# Patient Record
Sex: Female | Born: 1945
Health system: Southern US, Community
[De-identification: ages and names within clinical notes are randomized; demographics above are authoritative.]

## PROBLEM LIST (undated history)

## (undated) DIAGNOSIS — M1990 Unspecified osteoarthritis, unspecified site: Secondary | ICD-10-CM

## (undated) DIAGNOSIS — R011 Cardiac murmur, unspecified: Secondary | ICD-10-CM

## (undated) DIAGNOSIS — I10 Essential (primary) hypertension: Secondary | ICD-10-CM

## (undated) DIAGNOSIS — M810 Age-related osteoporosis without current pathological fracture: Secondary | ICD-10-CM

## (undated) DIAGNOSIS — G4730 Sleep apnea, unspecified: Secondary | ICD-10-CM

## (undated) DIAGNOSIS — I350 Nonrheumatic aortic (valve) stenosis: Secondary | ICD-10-CM

## (undated) DIAGNOSIS — E039 Hypothyroidism, unspecified: Secondary | ICD-10-CM

## (undated) DIAGNOSIS — J189 Pneumonia, unspecified organism: Secondary | ICD-10-CM

## (undated) HISTORY — PX: TUBAL LIGATION: SHX77

## (undated) HISTORY — PX: BREAST BIOPSY: SHX20

## (undated) HISTORY — DX: Sleep apnea, unspecified: G47.30

## (undated) HISTORY — PX: PARTIAL HYSTERECTOMY: SHX80

## (undated) HISTORY — PX: COLONOSCOPY: SHX174

## (undated) HISTORY — DX: Nonrheumatic aortic (valve) stenosis: I35.0

## (undated) HISTORY — DX: Age-related osteoporosis without current pathological fracture: M81.0

---

## 1979-03-20 HISTORY — PX: PARTIAL HYSTERECTOMY: SHX80

## 1999-06-02 ENCOUNTER — Other Ambulatory Visit: Admission: RE | Admit: 1999-06-02 | Discharge: 1999-06-02 | Payer: Self-pay | Admitting: Family Medicine

## 2002-05-23 ENCOUNTER — Other Ambulatory Visit: Admission: RE | Admit: 2002-05-23 | Discharge: 2002-05-23 | Payer: Self-pay | Admitting: Family Medicine

## 2012-10-09 ENCOUNTER — Encounter: Payer: Self-pay | Admitting: Cardiovascular Disease

## 2012-10-09 ENCOUNTER — Ambulatory Visit (INDEPENDENT_AMBULATORY_CARE_PROVIDER_SITE_OTHER): Payer: Medicare Other | Admitting: Cardiovascular Disease

## 2012-10-09 VITALS — BP 130/80 | HR 66 | Ht 64.0 in | Wt 180.0 lb

## 2012-10-09 DIAGNOSIS — I10 Essential (primary) hypertension: Secondary | ICD-10-CM

## 2012-10-09 DIAGNOSIS — R931 Abnormal findings on diagnostic imaging of heart and coronary circulation: Secondary | ICD-10-CM

## 2012-10-09 DIAGNOSIS — R9389 Abnormal findings on diagnostic imaging of other specified body structures: Secondary | ICD-10-CM

## 2012-10-09 DIAGNOSIS — R011 Cardiac murmur, unspecified: Secondary | ICD-10-CM

## 2012-10-09 NOTE — Progress Notes (Signed)
67 yo referred by Dr Claud Kelp for murmur.  Reviewed echo from [facility] with mild to moderate AR and AV sclerosis.  Patient has fatigue but no other symptoms.  Denies chest pain dyspnea palpitations or syncope.  BP normally runs ok at home with some white coat elevation. No history of fevers, RHD, or prolonged febrile illness.  Compliant with meds. ON lotrel for BP.  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  All other systems reviewed and negative   General: Affect appropriate Healthy:  appears stated age HEENT: normal Neck supple with no adenopathy JVP normal no bruits no thyromegaly Lungs clear with no wheezing and good diaphragmatic motion Heart:  S1/S2 1/6 AS/AR  murmur,rub, gallop or click PMI normal Abdomen: benighn, BS positve, no tenderness, no AAA no bruit.  No HSM or HJR Distal pulses intact with no bruits No edema Neuro non-focal Skin warm and dry No muscular weakness  Medications Current Outpatient Prescriptions  Medication Sig Dispense Refill  . amLODipine-benazepril (LOTREL) 5-20 MG per capsule Take 1 capsule by mouth daily.      [name] aspirin EC 81 MG tablet Take 81 mg by mouth daily.      . fish oil-omega-3 fatty acids 1000 MG capsule Take 2 g by mouth daily.      . Flaxseed, Linseed, (FLAX SEEDS PO) Take by mouth.      . levothyroxine (SYNTHROID, LEVOTHROID) 125 MCG tablet Take 125 mcg by mouth daily.      . pravastatin (PRAVACHOL) 20 MG tablet Take 20 mg by mouth daily.       No current facility-administered medications for this visit.    Allergies Review of patient's allergies indicates not on file.  Family History: No family history on file.  Social History: History   Social History  . Marital Status: Unknown    Spouse  Name: N/A    Number of Children: N/A  . Years of Education: N/A   Occupational History  . Not on file.   Social History Main Topics  . Smoking status: Not on file  . Smokeless tobacco: Not on file  . Alcohol Use: Not on file  . Drug Use: Not on file  . Sexually Active: Not on file   Other Topics Concern  . Not on file   Social History Narrative  . No narrative on file    Electrocardiogram:  NSR rate 66 normal ECG  Assessment and Plan

## 2012-10-09 NOTE — Assessment & Plan Note (Signed)
Benign sounding murmur.  Mld to moderate AR should not cause symptoms Normal LV size and function Continue ACE/ARB F/U echo in a year

## 2012-10-09 NOTE — Patient Instructions (Signed)
Your physician wants you to follow-up in: YEAR WITH DR Haywood Filler will receive a reminder letter in the mail two months in advance. If you don't receive a letter, please call our office to schedule the follow-up appointment. Your physician recommends that you continue on your current medications as directed. Please refer to the Current Medication list given to you today.  Your physician has requested that you have an echocardiogram. Echocardiography is a painless test that uses sound waves to create images of your heart. It provides your doctor with information about the size and shape of your heart and how well your heart's chambers and valves are working. This procedure takes approximately one hour. There are no restrictions for this procedure.  IN A YEAR SEE DR Eden Emms SAME DAY

## 2012-10-09 NOTE — Assessment & Plan Note (Signed)
Well controlled.  Continue current medications and low sodium Dash type diet.    

## 2012-10-18 ENCOUNTER — Encounter: Payer: Self-pay | Admitting: Cardiovascular Disease

## 2013-10-09 ENCOUNTER — Other Ambulatory Visit (HOSPITAL_COMMUNITY): Payer: Self-pay | Admitting: Radiology

## 2013-10-09 ENCOUNTER — Ambulatory Visit (HOSPITAL_COMMUNITY): Payer: Medicare Other | Attending: Cardiology | Admitting: Radiology

## 2013-10-09 ENCOUNTER — Encounter: Payer: Self-pay | Admitting: Cardiovascular Disease

## 2013-10-09 ENCOUNTER — Ambulatory Visit (INDEPENDENT_AMBULATORY_CARE_PROVIDER_SITE_OTHER): Payer: Medicare Other | Admitting: Cardiovascular Disease

## 2013-10-09 VITALS — BP 150/85 | HR 61 | Ht 64.0 in | Wt 185.0 lb

## 2013-10-09 DIAGNOSIS — R011 Cardiac murmur, unspecified: Secondary | ICD-10-CM

## 2013-10-09 DIAGNOSIS — I10 Essential (primary) hypertension: Secondary | ICD-10-CM

## 2013-10-09 NOTE — Assessment & Plan Note (Signed)
Benign no need for SBE or yearly echo F/U with me in a year or if new symptoms arise

## 2013-10-09 NOTE — Progress Notes (Signed)
Echocardiogram Performed. 

## 2013-10-09 NOTE — Progress Notes (Signed)
Patient ID: Holly Patrick, female   DOB: 05/15/1946, 68 y.o.   MRN: [id number] 68 yo referred by Dr Claud KelpHemrick for murmur. Reviewed echo from [facility] with mild to moderate AR and AV sclerosis. Patient has fatigue but no other symptoms. Denies chest pain dyspnea palpitations or syncope. BP normally runs ok at home with some white coat elevation. No history of fevers, RHD, or prolonged febrile illness. Compliant with meds. ON lotrel for BP. 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 There was mild focal basal hypertrophy of the septum. Systolic function was normal. The estimated ejection fraction was in the range of 55% to 60%. Utley motion was normal; there were no regional Kaplan motion abnormalities. Doppler parameters are consistent with abnormal left ventricular relaxation (grade 1 diastolic dysfunction). Doppler parameters are consistent with high ventricular filling pressure. - Aortic valve: Mild regurgitation. - Mitral valve: Calcified annulus. Mild regurgitation. - Left atrium: The atrium was mildly dilated.   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  All other systems reviewed and negative  General: Affect appropriate Healthy:  appears stated age HEENT: normal Neck supple with no adenopathy JVP normal no bruits no thyromegaly Lungs clear with no wheezing and good diaphragmatic motion Heart:  S1/S2 1/6 AR  murmur, no rub, gallop or click PMI normal Abdomen: benighn, BS positve, no tenderness, no AAA no bruit.  No HSM or HJR Distal pulses intact with no bruits No edema Neuro non-focal Skin warm  and dry No muscular weakness   Current Outpatient Prescriptions  Medication Sig Dispense Refill  . alendronate (FOSAMAX) 70 MG tablet Take 70 mg by mouth once a week. Take with a full glass of water on an empty stomach.      [name]. amLODipine-benazepril (LOTREL) 5-20 MG per capsule Take 1 capsule by mouth daily.      [name]. aspirin EC 81 MG tablet Take 81 mg by mouth daily.      . fish oil-omega-3 fatty acids 1000 MG capsule Take 2 g by mouth daily.      . Flaxseed, Linseed, (FLAX SEEDS PO) Take by mouth.      . levothyroxine (SYNTHROID, LEVOTHROID) 125 MCG tablet Take 125 mcg by mouth daily.      . pravastatin (PRAVACHOL) 20 MG tablet Take 20 mg by mouth daily.       No current facility-administered medications for this visit.    Allergies  Review of patient's allergies indicates no known allergies.  Electrocardiogram:  SR rate 61 ICRBBB   Assessment and Plan

## 2013-10-09 NOTE — Patient Instructions (Signed)
Your physician wants you to follow-up in:  12 months.  You will receive a reminder letter in the mail two months in advance. If you don't receive a letter, please call our office to schedule the follow-up appointment.   

## 2013-10-09 NOTE — Assessment & Plan Note (Signed)
White coat component Runs ok at home continue current meds

## 2014-10-09 ENCOUNTER — Ambulatory Visit: Payer: Self-pay | Admitting: Cardiovascular Disease

## 2014-12-02 NOTE — Progress Notes (Signed)
Patient ID: Holly FlashBarbara K Egerton, female   DOB: 09/13/1945, 69 y.o.   MRN: [id number] 69 y.o.  referred by Dr Claud KelpHemrick for murmur. Reviewed echo from [facility] with mild to moderate AR and AV sclerosis. Patient has fatigue but no other symptoms. Denies chest pain dyspnea palpitations or syncope. BP normally runs ok at home with some white coat elevation. No history of fevers, RHD, or prolonged febrile illness. Compliant with meds. ON lotrel for BP. 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 There was mild focal basal hypertrophy of the septum. Systolic function was normal. The estimated ejection fraction was in the range of 55% to 60%. Prettyman motion was normal; there were no regional [place]ord motion abnormalities. Doppler parameters are consistent with abnormal left ventricular relaxation (grade 1 diastolic dysfunction). Doppler parameters are consistent with high ventricular filling pressure. - Aortic valve: Mild regurgitation. - Mitral valve: Calcified annulus. Mild regurgitation. - Left atrium: The atrium was mildly dilated.   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  All other systems reviewed and negative  General: Affect appropriate Healthy:  appears stated age HEENT: normal Neck supple with no adenopathy JVP normal no bruits no thyromegaly Lungs clear with no wheezing and good diaphragmatic motion Heart:  S1/S2 1/6 AR  murmur, no rub, gallop or click PMI normal Abdomen: benighn, BS positve, no tenderness, no AAA no bruit.  No HSM or  HJR Distal pulses intact with no bruits No edema Neuro non-focal Skin warm and dry No muscular weakness   Current Outpatient Prescriptions  Medication Sig Dispense Refill  . amLODipine-benazepril (LOTREL) 5-20 MG per capsule Take 1 capsule by mouth daily.    [name]. aspirin EC 81 MG tablet Take 81 mg by mouth daily.    . fish oil-omega-3 fatty acids 1000 MG capsule Take 2 g by mouth daily.    . Flaxseed, Linseed, (FLAX SEEDS PO) Take by mouth.    . levothyroxine (SYNTHROID, LEVOTHROID) 125 MCG tablet Take 125 mcg by mouth daily.    . pravastatin (PRAVACHOL) 20 MG tablet Take 20 mg by mouth daily.     No current facility-administered medications for this visit.    Allergies  Review of patient's allergies indicates no known allergies.  Electrocardiogram:   10/09/13  SR rate 61 ICRBBB   12/04/14  SR rate 62 normal   Assessment and Plan AR:  Mild soft murmur no change active with no dyspnea clinical f/u in a year  HTN:  Well controlled.  Continue current medications and low sodium Dash type diet.   Thyroid:  On replacement TSH with primary in [place] ChoL  On statin Cholesterol is at goal.  Continue current dose of statin and diet Rx.  No myalgias or side effects.  F/U  LFT's in 6 months. No results found for: [facility]  [facility]  Sullivan Blasing

## 2014-12-04 ENCOUNTER — Encounter: Payer: Self-pay | Admitting: Cardiovascular Disease

## 2014-12-04 ENCOUNTER — Ambulatory Visit (INDEPENDENT_AMBULATORY_CARE_PROVIDER_SITE_OTHER): Payer: Medicare Other | Admitting: Cardiovascular Disease

## 2014-12-04 VITALS — BP 122/80 | HR 62 | Ht 64.0 in | Wt 176.1 lb

## 2014-12-04 DIAGNOSIS — I351 Nonrheumatic aortic (valve) insufficiency: Secondary | ICD-10-CM | POA: Diagnosis not present

## 2014-12-04 NOTE — Patient Instructions (Signed)
Medication Instructions:  NO CHANGES  Labwork: NONE  Testing/Procedures: NONE  Follow-Up: Your physician wants you to follow-up in: YEAR WITH  DR NISHAN You will receive a reminder letter in the mail two months in advance. If you don't receive a letter, please call our office to schedule the follow-up appointment.   Any Other Special Instructions Will Be Listed Below (If Applicable).   

## 2015-09-16 DIAGNOSIS — E039 Hypothyroidism, unspecified: Secondary | ICD-10-CM | POA: Diagnosis not present

## 2015-09-16 DIAGNOSIS — E7800 Pure hypercholesterolemia, unspecified: Secondary | ICD-10-CM | POA: Diagnosis not present

## 2015-09-16 DIAGNOSIS — Z79899 Other long term (current) drug therapy: Secondary | ICD-10-CM | POA: Diagnosis not present

## 2015-09-19 DIAGNOSIS — E7800 Pure hypercholesterolemia, unspecified: Secondary | ICD-10-CM | POA: Diagnosis not present

## 2015-09-19 DIAGNOSIS — E669 Obesity, unspecified: Secondary | ICD-10-CM | POA: Diagnosis not present

## 2015-09-19 DIAGNOSIS — E039 Hypothyroidism, unspecified: Secondary | ICD-10-CM | POA: Diagnosis not present

## 2015-09-19 DIAGNOSIS — Z6832 Body mass index (BMI) 32.0-32.9, adult: Secondary | ICD-10-CM | POA: Diagnosis not present

## 2015-09-19 DIAGNOSIS — I10 Essential (primary) hypertension: Secondary | ICD-10-CM | POA: Diagnosis not present

## 2015-10-28 DIAGNOSIS — Z6830 Body mass index (BMI) 30.0-30.9, adult: Secondary | ICD-10-CM | POA: Diagnosis not present

## 2015-10-28 DIAGNOSIS — R1011 Right upper quadrant pain: Secondary | ICD-10-CM | POA: Diagnosis not present

## 2015-10-30 DIAGNOSIS — R1011 Right upper quadrant pain: Secondary | ICD-10-CM | POA: Diagnosis not present

## 2015-11-26 DIAGNOSIS — E2839 Other primary ovarian failure: Secondary | ICD-10-CM | POA: Diagnosis not present

## 2015-11-26 DIAGNOSIS — M810 Age-related osteoporosis without current pathological fracture: Secondary | ICD-10-CM | POA: Diagnosis not present

## 2015-11-27 DIAGNOSIS — Z1231 Encounter for screening mammogram for malignant neoplasm of breast: Secondary | ICD-10-CM | POA: Diagnosis not present

## 2015-12-01 NOTE — Progress Notes (Signed)
Patient ID: Holly FlashBarbara K Patrick, female   DOB: 10/27/1945, 70 y.o.   MRN: [id number]   70 y.o.  referred by Dr Claud KelpHemrick for murmur. Reviewed echo from [facility] with mild to moderate AR and AV sclerosis. Patient has fatigue but no other symptoms. Denies chest pain dyspnea palpitations or syncope. BP normally runs ok at home with some white coat elevation. No history of fevers, RHD, or prolonged febrile illness. Compliant with meds. ON lotrel for BP. 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 There was mild focal basal hypertrophy of the septum. Systolic function was normal. The estimated ejection fraction was in the range of 55% to 60%. [place]ott motion was normal; there were no regional Quinton motion abnormalities. Doppler parameters are consistent with abnormal left ventricular relaxation (grade 1 diastolic dysfunction). Doppler parameters are consistent with high ventricular filling pressure. - Aortic valve: Mild regurgitation. - Mitral valve: Calcified annulus. Mild regurgitation. - Left atrium: The atrium was mildly dilated.   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  All other systems reviewed and negative  General: Affect appropriate Healthy:  appears stated age HEENT: normal Neck supple with no adenopathy JVP normal no bruits no thyromegaly Lungs clear with no wheezing and good diaphragmatic motion Heart:  S1/S2 1/6 AR  murmur, no rub, gallop  or click PMI normal Abdomen: benighn, BS positve, no tenderness, no AAA no bruit.  No HSM or HJR Distal pulses intact with no bruits No edema Neuro non-focal Skin warm and dry No muscular weakness   Current Outpatient Prescriptions  Medication Sig Dispense Refill  . amLODipine-benazepril (LOTREL) 5-20 MG per capsule Take 1 capsule by mouth daily.    [name]. aspirin EC 81 MG tablet Take 81 mg by mouth daily.    . fish oil-omega-3 fatty acids 1000 MG capsule Take 2 g by mouth daily.    . Flaxseed, Linseed, (FLAX SEEDS PO) Take by mouth.    . levothyroxine (SYNTHROID, LEVOTHROID) 125 MCG tablet Take 125 mcg by mouth daily.    . pravastatin (PRAVACHOL) 20 MG tablet Take 20 mg by mouth daily.     No current facility-administered medications for this visit.    Allergies  Review of patient's allergies indicates no known allergies.  Electrocardiogram:   10/09/13  SR rate 61 ICRBBB   12/04/14  SR rate 62 normal  12/08/15  NSR rate 60 normal   Assessment and Plan AR:  Mild soft murmur no change active with no dyspnea clinical f/u in a year  HTN:  Well controlled.  Continue current medications and low sodium Dash type diet.   Thyroid:  On replacement TSH with primary in [place] dose recently decreased  ChoL  On statin Cholesterol is at goal.  Continue current dose of statin and diet Rx.  No myalgias or side effects.  F/U  LFT's in 6 months. [facility]: [facility]  [facility]  [name]

## 2015-12-08 ENCOUNTER — Encounter: Payer: Self-pay | Admitting: Cardiovascular Disease

## 2015-12-08 ENCOUNTER — Ambulatory Visit (INDEPENDENT_AMBULATORY_CARE_PROVIDER_SITE_OTHER): Payer: Medicare Other | Admitting: Cardiovascular Disease

## 2015-12-08 VITALS — BP 150/70 | HR 62 | Ht 62.5 in | Wt 168.4 lb

## 2015-12-08 DIAGNOSIS — I351 Nonrheumatic aortic (valve) insufficiency: Secondary | ICD-10-CM

## 2015-12-08 NOTE — Patient Instructions (Signed)

## 2016-03-19 DIAGNOSIS — Z79899 Other long term (current) drug therapy: Secondary | ICD-10-CM | POA: Diagnosis not present

## 2016-03-19 DIAGNOSIS — E7800 Pure hypercholesterolemia, unspecified: Secondary | ICD-10-CM | POA: Diagnosis not present

## 2016-03-19 DIAGNOSIS — E039 Hypothyroidism, unspecified: Secondary | ICD-10-CM | POA: Diagnosis not present

## 2016-03-24 DIAGNOSIS — Z6830 Body mass index (BMI) 30.0-30.9, adult: Secondary | ICD-10-CM | POA: Diagnosis not present

## 2016-03-24 DIAGNOSIS — I10 Essential (primary) hypertension: Secondary | ICD-10-CM | POA: Diagnosis not present

## 2016-03-24 DIAGNOSIS — Z9181 History of falling: Secondary | ICD-10-CM | POA: Diagnosis not present

## 2016-03-24 DIAGNOSIS — E039 Hypothyroidism, unspecified: Secondary | ICD-10-CM | POA: Diagnosis not present

## 2016-03-24 DIAGNOSIS — Z1389 Encounter for screening for other disorder: Secondary | ICD-10-CM | POA: Diagnosis not present

## 2016-03-24 DIAGNOSIS — E7800 Pure hypercholesterolemia, unspecified: Secondary | ICD-10-CM | POA: Diagnosis not present

## 2016-05-13 DIAGNOSIS — H2513 Age-related nuclear cataract, bilateral: Secondary | ICD-10-CM | POA: Diagnosis not present

## 2016-09-21 DIAGNOSIS — I10 Essential (primary) hypertension: Secondary | ICD-10-CM | POA: Diagnosis not present

## 2016-09-21 DIAGNOSIS — E039 Hypothyroidism, unspecified: Secondary | ICD-10-CM | POA: Diagnosis not present

## 2016-09-21 DIAGNOSIS — E7800 Pure hypercholesterolemia, unspecified: Secondary | ICD-10-CM | POA: Diagnosis not present

## 2016-09-27 DIAGNOSIS — I10 Essential (primary) hypertension: Secondary | ICD-10-CM | POA: Diagnosis not present

## 2016-09-27 DIAGNOSIS — E039 Hypothyroidism, unspecified: Secondary | ICD-10-CM | POA: Diagnosis not present

## 2016-09-27 DIAGNOSIS — M810 Age-related osteoporosis without current pathological fracture: Secondary | ICD-10-CM | POA: Diagnosis not present

## 2016-09-27 DIAGNOSIS — E7800 Pure hypercholesterolemia, unspecified: Secondary | ICD-10-CM | POA: Diagnosis not present

## 2016-10-29 ENCOUNTER — Encounter: Payer: Self-pay | Admitting: Cardiovascular Disease

## 2016-11-16 NOTE — Progress Notes (Signed)
Patient ID: MARKESHIA GIEBEL, female   DOB: 1946/05/21, 71 y.o.   MRN: [id number]   71 y.o.  referred by Dr [facility] for murmur. Reviewed echo from [facility] with mild to moderate AR and AV sclerosis. Patient has fatigue but no other symptoms. Denies chest pain dyspnea palpitations or syncope. BP normally runs ok at home with some white coat elevation. No history of fevers, RHD, or prolonged febrile illness. Compliant with meds. ON lotrel for BP. 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 There was mild focal basal hypertrophy of the septum. Systolic function was normal. The estimated ejection fraction was in the range of 55% to 60%. Stamps motion was normal; there were no regional Loschiavo motion abnormalities. Doppler parameters are consistent with abnormal left ventricular relaxation (grade 1 diastolic dysfunction). Doppler parameters are consistent with high ventricular filling pressure. - Aortic valve: Mild regurgitation. - Mitral valve: Calcified annulus. Mild regurgitation. - Left atrium: The atrium was mildly dilated.   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  All other systems reviewed and negative  General:  BP (!) 150/90   Pulse 71   Ht  (1.626 m)   Wt 171 lb 12.8 oz (77.9 kg)   SpO2 97%   BMI 29.49 kg/m  BP recheck by me 130 80  Affect appropriate Healthy:   appears stated age HEENT: normal Neck supple with no adenopathy JVP normal no bruits no thyromegaly Lungs clear with no wheezing and good diaphragmatic motion Heart:  S1/S2 1/6 AR  murmur, no rub, gallop or click PMI normal Abdomen: benighn, BS positve, no tenderness, no AAA no bruit.  No HSM or HJR Distal pulses intact with no bruits No edema Neuro non-focal Skin warm and dry No muscular weakness   Current Outpatient Prescriptions  Medication Sig Dispense Refill  . amLODipine (NORVASC) 5 MG tablet Take 5 mg by mouth daily.  2  . aspirin EC 81 MG tablet Take 81 mg by mouth daily.    . benazepril (LOTENSIN) 20 MG tablet Take 20 mg by mouth daily.  2  . fish oil-omega-3 fatty acids 1000 MG capsule Take 2 g by mouth daily.    . Flaxseed, Linseed, (FLAX SEEDS PO) Take 1 tablet by mouth.     . levothyroxine (SYNTHROID, LEVOTHROID) 112 MCG tablet Take 112 mcg by mouth daily.  2  . pravastatin (PRAVACHOL) 20 MG tablet Take 20 mg by mouth daily.     No current facility-administered medications for this visit.     Allergies  Patient has no known allergies.  Electrocardiogram:   10/09/13  SR rate 61 ICRBBB   12/04/14  SR rate 62 normal  12/08/15  NSR rate 60 normal   Assessment and Plan AR:  Mild by echo in 2015 f/u echo no need for SBE not severe by exam pulse pressure normal  HTN:  Well controlled.  Continue  current medications and low sodium Dash type diet.   Thyroid:  On replacement TSH with primary in [place] dose recently decreased  ChoL  On statin Cholesterol is at goal.  Continue current dose of statin and diet Rx.  No myalgias or side effects.  F/U  LFT's in 6 months. No results found for: [facility]  Labs with primary in [facility]

## 2016-11-18 ENCOUNTER — Ambulatory Visit (INDEPENDENT_AMBULATORY_CARE_PROVIDER_SITE_OTHER): Payer: Medicare Other | Admitting: Cardiovascular Disease

## 2016-11-18 ENCOUNTER — Encounter: Payer: Self-pay | Admitting: Cardiovascular Disease

## 2016-11-18 VITALS — BP 130/80 | HR 71 | Ht 64.0 in | Wt 171.8 lb

## 2016-11-18 DIAGNOSIS — I351 Nonrheumatic aortic (valve) insufficiency: Secondary | ICD-10-CM | POA: Diagnosis not present

## 2016-11-18 NOTE — Patient Instructions (Addendum)

## 2016-12-06 ENCOUNTER — Other Ambulatory Visit: Payer: Self-pay

## 2016-12-06 ENCOUNTER — Ambulatory Visit (HOSPITAL_COMMUNITY): Payer: Medicare Other | Attending: Cardiology

## 2016-12-06 DIAGNOSIS — I351 Nonrheumatic aortic (valve) insufficiency: Secondary | ICD-10-CM | POA: Diagnosis not present

## 2016-12-06 DIAGNOSIS — I081 Rheumatic disorders of both mitral and tricuspid valves: Secondary | ICD-10-CM | POA: Diagnosis not present

## 2016-12-06 DIAGNOSIS — I700 Atherosclerosis of aorta: Secondary | ICD-10-CM | POA: Diagnosis not present

## 2016-12-15 ENCOUNTER — Telehealth: Payer: Self-pay | Admitting: Cardiovascular Disease

## 2016-12-15 NOTE — Telephone Encounter (Signed)
Called patient about her echo results. Patient aware of results.

## 2016-12-15 NOTE — Telephone Encounter (Signed)
New Message   pt verbalized that she is returning call for rn   About echo results

## 2017-04-05 DIAGNOSIS — E039 Hypothyroidism, unspecified: Secondary | ICD-10-CM | POA: Diagnosis not present

## 2017-04-05 DIAGNOSIS — E7800 Pure hypercholesterolemia, unspecified: Secondary | ICD-10-CM | POA: Diagnosis not present

## 2017-04-05 DIAGNOSIS — I10 Essential (primary) hypertension: Secondary | ICD-10-CM | POA: Diagnosis not present

## 2017-04-07 DIAGNOSIS — M810 Age-related osteoporosis without current pathological fracture: Secondary | ICD-10-CM | POA: Diagnosis not present

## 2017-04-07 DIAGNOSIS — I10 Essential (primary) hypertension: Secondary | ICD-10-CM | POA: Diagnosis not present

## 2017-04-07 DIAGNOSIS — E7800 Pure hypercholesterolemia, unspecified: Secondary | ICD-10-CM | POA: Diagnosis not present

## 2017-04-07 DIAGNOSIS — E039 Hypothyroidism, unspecified: Secondary | ICD-10-CM | POA: Diagnosis not present

## 2017-05-02 DIAGNOSIS — Z0000 Encounter for general adult medical examination without abnormal findings: Secondary | ICD-10-CM | POA: Diagnosis not present

## 2017-05-02 DIAGNOSIS — Z1211 Encounter for screening for malignant neoplasm of colon: Secondary | ICD-10-CM | POA: Diagnosis not present

## 2017-10-11 DIAGNOSIS — I10 Essential (primary) hypertension: Secondary | ICD-10-CM | POA: Diagnosis not present

## 2017-10-11 DIAGNOSIS — E7800 Pure hypercholesterolemia, unspecified: Secondary | ICD-10-CM | POA: Diagnosis not present

## 2017-10-11 DIAGNOSIS — M810 Age-related osteoporosis without current pathological fracture: Secondary | ICD-10-CM | POA: Diagnosis not present

## 2017-10-11 DIAGNOSIS — E039 Hypothyroidism, unspecified: Secondary | ICD-10-CM | POA: Diagnosis not present

## 2017-11-15 DIAGNOSIS — Z0000 Encounter for general adult medical examination without abnormal findings: Secondary | ICD-10-CM | POA: Diagnosis not present

## 2017-11-15 DIAGNOSIS — N959 Unspecified menopausal and perimenopausal disorder: Secondary | ICD-10-CM | POA: Diagnosis not present

## 2017-11-15 DIAGNOSIS — Z1231 Encounter for screening mammogram for malignant neoplasm of breast: Secondary | ICD-10-CM | POA: Diagnosis not present

## 2017-11-15 DIAGNOSIS — Z136 Encounter for screening for cardiovascular disorders: Secondary | ICD-10-CM | POA: Diagnosis not present

## 2017-11-21 ENCOUNTER — Encounter: Payer: Self-pay | Admitting: Cardiovascular Disease

## 2017-11-21 ENCOUNTER — Ambulatory Visit: Payer: Medicare Other | Admitting: Cardiovascular Disease

## 2017-11-21 VITALS — BP 136/74 | HR 70 | Ht 64.0 in | Wt 166.0 lb

## 2017-11-21 DIAGNOSIS — I351 Nonrheumatic aortic (valve) insufficiency: Secondary | ICD-10-CM

## 2017-11-21 NOTE — Progress Notes (Signed)
Patient ID: Holly Patrick, female   DOB: February 03, 1946, 72 y.o.   MRN: [id number]   72 y.o.  referred by Dr Claud Kelp for murmur. Reviewed echo from [facility] with mild to moderate AR and AV sclerosis. Patient has fatigue but no other symptoms. Denies chest pain dyspnea palpitations or syncope. BP normally runs ok at home with some white coat elevation. No history of fevers, RHD, or prolonged febrile illness. Compliant with meds. ON lotrel for BP. 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  All other systems reviewed and negative  General:  BP 136/74   Pulse 70   Ht  (1.626 m)   Wt 166 lb (75.3 kg)   SpO2 98%   BMI 28.49 kg/m  BP recheck by me 130 80  Affect appropriate Healthy:  appears stated age HEENT: normal Neck supple with no adenopathy JVP normal no bruits no thyromegaly Lungs clear with no wheezing and good diaphragmatic motion Heart:  S1/S2 1/6 AR/AS   murmur, no rub, gallop or click PMI normal Abdomen: benighn, BS positve, no tenderness, no AAA no bruit.  No HSM or HJR Distal pulses intact with no bruits No edema Neuro non-focal Skin warm and dry No muscular weakness   Current Outpatient Medications  Medication Sig Dispense Refill  . alendronate (FOSAMAX) 70 MG tablet Take 70 mg by mouth once a week. Take with a full glass of water  on an empty stomach.    [name] amLODipine (NORVASC) 10 MG tablet Take 10 mg by mouth daily.    [name] aspirin EC 81 MG tablet Take 81 mg by mouth daily.    [name] atorvastatin (LIPITOR) 10 MG tablet Take 10 mg by mouth daily.    . benazepril (LOTENSIN) 20 MG tablet Take 20 mg by mouth daily.  2  . fish oil-omega-3 fatty acids 1000 MG capsule Take 2 g by mouth daily.    . Flaxseed, Linseed, (FLAX SEEDS PO) Take 1 tablet by mouth.     . levothyroxine (SYNTHROID, LEVOTHROID) 112 MCG tablet Take 112 mcg by mouth daily.  2   No current facility-administered medications for this visit.     Allergies  Patient has no known allergies.  Electrocardiogram:  11/21/17 SR ate 68 normal  Assessment and Plan AR/AS:  Mild by echo 12/06/16 f/u echo in a year since asymptomatic  HTN:  Well controlled.  Continue current medications and low sodium Dash type diet.   Thyroid:  On replacement TSH with primary in [place] dose recently decreased  ChoL  On statin Cholesterol is at goal.  Continue current dose of statin and diet Rx.  No myalgias or side effects.  F/U  Labs with primary   Charlton Haws

## 2017-11-21 NOTE — Patient Instructions (Addendum)
Medication Instructions:  Your physician recommends that you continue on your current medications as directed. Please refer to the Current Medication list given to you today.  Labwork: NONE  Testing/Procedures: Your physician has requested that you have an echocardiogram in 1 year. Echocardiography is a painless test that uses sound waves to create images of your heart. It provides your doctor with information about the size and shape of your heart and how well your heart's chambers and valves are working. This procedure takes approximately one hour. There are no restrictions for this procedure.  Follow-Up: Your physician wants you to follow-up in: 12 months with Dr. Nishan. You will receive a reminder letter in the mail two months in advance. If you don't receive a letter, please call our office to schedule the follow-up appointment.   If you need a refill on your cardiac medications before your next appointment, please call your pharmacy.    

## 2017-11-30 DIAGNOSIS — Z1231 Encounter for screening mammogram for malignant neoplasm of breast: Secondary | ICD-10-CM | POA: Diagnosis not present

## 2017-12-06 DIAGNOSIS — R928 Other abnormal and inconclusive findings on diagnostic imaging of breast: Secondary | ICD-10-CM | POA: Diagnosis not present

## 2017-12-06 DIAGNOSIS — N6489 Other specified disorders of breast: Secondary | ICD-10-CM | POA: Diagnosis not present

## 2017-12-08 DIAGNOSIS — M8589 Other specified disorders of bone density and structure, multiple sites: Secondary | ICD-10-CM | POA: Diagnosis not present

## 2017-12-08 DIAGNOSIS — M810 Age-related osteoporosis without current pathological fracture: Secondary | ICD-10-CM | POA: Diagnosis not present

## 2018-02-23 DIAGNOSIS — H2513 Age-related nuclear cataract, bilateral: Secondary | ICD-10-CM | POA: Diagnosis not present

## 2018-04-17 DIAGNOSIS — M810 Age-related osteoporosis without current pathological fracture: Secondary | ICD-10-CM | POA: Diagnosis not present

## 2018-04-17 DIAGNOSIS — E7800 Pure hypercholesterolemia, unspecified: Secondary | ICD-10-CM | POA: Diagnosis not present

## 2018-04-17 DIAGNOSIS — E039 Hypothyroidism, unspecified: Secondary | ICD-10-CM | POA: Diagnosis not present

## 2018-04-17 DIAGNOSIS — I10 Essential (primary) hypertension: Secondary | ICD-10-CM | POA: Diagnosis not present

## 2018-06-13 DIAGNOSIS — R928 Other abnormal and inconclusive findings on diagnostic imaging of breast: Secondary | ICD-10-CM | POA: Diagnosis not present

## 2018-06-30 DIAGNOSIS — M79601 Pain in right arm: Secondary | ICD-10-CM | POA: Diagnosis not present

## 2018-10-19 DIAGNOSIS — E039 Hypothyroidism, unspecified: Secondary | ICD-10-CM | POA: Diagnosis not present

## 2018-10-19 DIAGNOSIS — I10 Essential (primary) hypertension: Secondary | ICD-10-CM | POA: Diagnosis not present

## 2018-10-19 DIAGNOSIS — M810 Age-related osteoporosis without current pathological fracture: Secondary | ICD-10-CM | POA: Diagnosis not present

## 2018-10-19 DIAGNOSIS — E7800 Pure hypercholesterolemia, unspecified: Secondary | ICD-10-CM | POA: Diagnosis not present

## 2018-11-24 ENCOUNTER — Telehealth: Payer: Self-pay

## 2018-11-24 ENCOUNTER — Telehealth (HOSPITAL_COMMUNITY): Payer: Self-pay | Admitting: Radiology

## 2018-11-24 NOTE — Telephone Encounter (Signed)

## 2018-11-24 NOTE — Telephone Encounter (Signed)
New Message   Patient able to do televisit please call to get consent and help setup.

## 2018-11-24 NOTE — Telephone Encounter (Signed)
Left message for patient to call back. Patient has appt on 5/20, need to discuss virtual visit and consent for virtual visit when patient calls back.

## 2018-11-24 NOTE — Telephone Encounter (Signed)
Follow Up: ° ° ° ° °Pt returning your call. °

## 2018-11-27 ENCOUNTER — Other Ambulatory Visit: Payer: Self-pay

## 2018-11-27 ENCOUNTER — Ambulatory Visit (HOSPITAL_COMMUNITY): Payer: Medicare Other | Attending: Cardiology

## 2018-11-27 ENCOUNTER — Telehealth: Payer: Self-pay

## 2018-11-27 DIAGNOSIS — I351 Nonrheumatic aortic (valve) insufficiency: Secondary | ICD-10-CM | POA: Diagnosis not present

## 2018-11-27 DIAGNOSIS — I350 Nonrheumatic aortic (valve) stenosis: Secondary | ICD-10-CM

## 2018-11-27 NOTE — Telephone Encounter (Signed)

## 2018-11-27 NOTE — Telephone Encounter (Signed)
-----   Message from Wendall Stade, MD sent at 11/27/2018  3:56 PM EDT ----- EF normal AS moderate AR mild f/u echo in a year stable

## 2018-11-27 NOTE — Telephone Encounter (Signed)
Patient aware of results. Per Dr. Eden Emms, EF normal AS moderate AR mild f/u echo in a year stable. Patient verbalized understanding. Ordered placed for echo in one year.

## 2018-11-28 NOTE — Progress Notes (Signed)
Virtual Visit via Video Note   This visit type was conducted due to national recommendations for restrictions regarding the COVID-19 Pandemic (e.g. social distancing) in an effort to limit this patient's exposure and mitigate transmission in our community.  Due to her co-morbid illnesses, this patient is at least at moderate risk for complications without adequate follow up.  This format is felt to be most appropriate for this patient at this time.  All issues noted in this document were discussed and addressed.  A limited physical exam was performed with this format.  Please refer to the patient's chart for her consent to telehealth for [facility].   Date:  12/06/2018   ID:  Holly Patrick, DOB 12/29/1945, MRN [id number]  Patient Location: Home Provider Location: Office  PCP:  Lonie Peakonroy, Nathan, PA-C  Cardiologist:  Eden EmmsNishan Electrophysiologist:  None   Evaluation Performed:  Follow-Up Visit  Chief Complaint:  Aortic Valve Disease   History of Present Illness:    Holly Patrick is a 73 y.o. 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    Past Medical History:  Diagnosis Date  . Osteoporosis   . Sleep apnea    Past Surgical History:  Procedure Laterality Date  . PARTIAL HYSTERECTOMY  1980's     Current Meds  Medication Sig  . alendronate (FOSAMAX) 70 MG tablet  Take 70 mg by mouth once a week. Take with a full glass of water on an empty stomach.  [name]. amLODipine (NORVASC) 10 MG tablet Take 10 mg by mouth daily.  [name]. aspirin EC 81 MG tablet Take 81 mg by mouth daily.  [name]. atorvastatin (LIPITOR) 10 MG tablet Take 10 mg by mouth daily.  . benazepril (LOTENSIN) 20 MG tablet Take 20 mg by mouth daily.  . calcium carbonate (CALCIUM 600) 1500 (600 Ca) MG TABS tablet Take 2 tablets by mouth daily.  . fish oil-omega-3 fatty acids 1000 MG capsule Take 2 g by mouth daily.  . Flaxseed, Linseed, (FLAX SEEDS PO) Take 1 tablet by mouth.   . levothyroxine (SYNTHROID, LEVOTHROID) 112 MCG tablet Take 112 mcg by mouth daily.     Allergies:   Patient has no known allergies.   Social History   Tobacco Use  . Smoking status: Never Smoker  . Smokeless tobacco: Never Used  Substance Use Topics  . Alcohol use: No  . Drug use: No     Family Hx: The patient's family history includes Alzheimer's disease in her mother; Stroke in her father.  ROS:   Please see the history of present illness.     All other systems reviewed and are negative.   Prior CV studies:   The following studies were reviewed today:  Echo 11/27/18  Labs/Other Tests and Data Reviewed:    EKG:  SR rate 68 normal no LVH 11/21/17  Recent Labs: No results found for requested labs within last 8760 hours.   Recent Lipid Panel No results found for: CHOL, TRIG, HDL, CHOLHDL, LDLCALC, LDLDIRECT  Wt Readings from Last 3 Encounters:  12/06/18 74.4 kg  11/21/17 75.3 kg  11/18/16 77.9 kg     Objective:    Vital Signs:  BP (!) 108/56   Pulse 61   Ht 5\' 3"  (1.6 m)   Wt 74.4 kg   BMI 29.05 kg/m    Healthy appearing female No distress Skin warm and dry No tachypnea No JVP elevation  No edema  ASSESSMENT & PLAN:    AR/AS:  F/u echo in a year some progression of gradients asymptomatic acitve  HTN:  Well controlled.  Continue current medications and low sodium Dash type diet.   Thyroid:  On  replacement TSH with primary in [place] dose recently decreased  ChoL  On statin Cholesterol is at goal.  Continue current dose of statin and diet Rx.  No myalgias or side effects.  F/U  Labs with primary   COVID-19 Education: The signs and symptoms of COVID-19 were discussed with the patient and how to seek care for testing (follow up with PCP or arrange E-visit).  The importance of social distancing was discussed today.  Time:   Today, I have spent 30 minutes with the patient with telehealth technology discussing the above problems.     Medication Adjustments/Labs and Tests Ordered: Current medicines are reviewed at length with the patient today.  Concerns regarding medicines are outlined above.   Tests Ordered: No orders of the defined types were placed in this encounter.   Medication Changes: No orders of the defined types were placed in this encounter.   Disposition:  Follow up in a year with echo for AS  Signed, Charlton Haws, MD  12/06/2018 10:36 AM    [facility]

## 2018-12-06 ENCOUNTER — Encounter: Payer: Self-pay | Admitting: Cardiovascular Disease

## 2018-12-06 ENCOUNTER — Telehealth (INDEPENDENT_AMBULATORY_CARE_PROVIDER_SITE_OTHER): Payer: Medicare Other | Admitting: Cardiovascular Disease

## 2018-12-06 ENCOUNTER — Other Ambulatory Visit: Payer: Self-pay

## 2018-12-06 VITALS — BP 108/56 | HR 61 | Ht 63.0 in | Wt 164.0 lb

## 2018-12-06 DIAGNOSIS — I350 Nonrheumatic aortic (valve) stenosis: Secondary | ICD-10-CM

## 2018-12-06 NOTE — Patient Instructions (Addendum)
Medication Instructions:   If you need a refill on your cardiac medications before your next appointment, please call your pharmacy.   Lab work:  If you have labs (blood work) drawn today and your tests are completely normal, you will receive your results only by: . MyChart Message (if you have MyChart) OR . A paper copy in the mail If you have any lab test that is abnormal or we need to change your treatment, we will call you to review the results.  Testing/Procedures: Your physician has requested that you have an echocardiogram in one year. Echocardiography is a painless test that uses sound waves to create images of your heart. It provides your doctor with information about the size and shape of your heart and how well your heart's chambers and valves are working. This procedure takes approximately one hour. There are no restrictions for this procedure.  Follow-Up: At [facility], you and your health needs are our priority.  As part of our continuing mission to provide you with exceptional heart care, we have created designated Provider Care Teams.  These Care Teams include your primary Cardiologist (physician) and Advanced Practice Providers (APPs -  Physician Assistants and Nurse Practitioners) who all work together to provide you with the care you need, when you need it. You will need a follow up appointment in 12 months.  Please call our office 2 months in advance to schedule this appointment.  You may see Dr. Nishan or one of the following Advanced Practice Providers on your designated Care Team:   Lori Gerhardt, NP Laura Ingold, NP . Jill McDaniel, NP    

## 2018-12-12 DIAGNOSIS — R928 Other abnormal and inconclusive findings on diagnostic imaging of breast: Secondary | ICD-10-CM | POA: Diagnosis not present

## 2019-03-16 DIAGNOSIS — E7800 Pure hypercholesterolemia, unspecified: Secondary | ICD-10-CM | POA: Diagnosis not present

## 2019-03-16 DIAGNOSIS — I10 Essential (primary) hypertension: Secondary | ICD-10-CM | POA: Diagnosis not present

## 2019-03-16 DIAGNOSIS — E039 Hypothyroidism, unspecified: Secondary | ICD-10-CM | POA: Diagnosis not present

## 2019-03-20 DIAGNOSIS — Z6830 Body mass index (BMI) 30.0-30.9, adult: Secondary | ICD-10-CM | POA: Diagnosis not present

## 2019-03-20 DIAGNOSIS — E7800 Pure hypercholesterolemia, unspecified: Secondary | ICD-10-CM | POA: Diagnosis not present

## 2019-03-20 DIAGNOSIS — I10 Essential (primary) hypertension: Secondary | ICD-10-CM | POA: Diagnosis not present

## 2019-03-20 DIAGNOSIS — E039 Hypothyroidism, unspecified: Secondary | ICD-10-CM | POA: Diagnosis not present

## 2019-04-03 DIAGNOSIS — L578 Other skin changes due to chronic exposure to nonionizing radiation: Secondary | ICD-10-CM | POA: Diagnosis not present

## 2019-04-03 DIAGNOSIS — C44311 Basal cell carcinoma of skin of nose: Secondary | ICD-10-CM | POA: Diagnosis not present

## 2019-04-19 DIAGNOSIS — C44311 Basal cell carcinoma of skin of nose: Secondary | ICD-10-CM | POA: Diagnosis not present

## 2019-09-04 DIAGNOSIS — C44311 Basal cell carcinoma of skin of nose: Secondary | ICD-10-CM | POA: Diagnosis not present

## 2019-09-19 DIAGNOSIS — E7800 Pure hypercholesterolemia, unspecified: Secondary | ICD-10-CM | POA: Diagnosis not present

## 2019-09-19 DIAGNOSIS — I10 Essential (primary) hypertension: Secondary | ICD-10-CM | POA: Diagnosis not present

## 2019-09-19 DIAGNOSIS — E039 Hypothyroidism, unspecified: Secondary | ICD-10-CM | POA: Diagnosis not present

## 2019-09-19 DIAGNOSIS — Z1331 Encounter for screening for depression: Secondary | ICD-10-CM | POA: Diagnosis not present

## 2019-12-20 NOTE — Progress Notes (Signed)
Date:  12/24/2019   ID:  Holly ESQUIVIAS, DOB 07-01-1946, MRN [id number]  PCP:  Cyndi Bender, PA-C  Cardiologist:  Johnsie Cancel Electrophysiologist:  None   Evaluation Performed:  Follow-Up Visit  Chief Complaint:  Aortic Valve Disease   History of Present Illness:    Holly Patrick is a 74 y.o. 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 Marchia Bond has had some issues with non alcoholic cirrhosis had surgery with mass removal but still with chronic issues   Echo 11/27/18 reviewed mild progression  EF 60-65% moderate AS mild AR Peak V- 3.1 m/sec, mean gr- 21 mmHg peak 32 mmHg DVI 0.36   Echo today stable with mean gradient 22 mmHg   Husband of 56 years past this year She has a son and daughter near by who support her and help with the famr The have organic chickens, hay and cows  She has no dyspnea, syncope chest pain or palpitations   The patient does not have symptoms concerning for COVID-19 infection (fever, chills, cough, or new shortness of breath).    Past Medical History:  Diagnosis Date  . Osteoporosis   . Sleep apnea    Past Surgical History:  Procedure Laterality Date  . PARTIAL HYSTERECTOMY  1980's     No outpatient medications have been marked as taking for the 12/24/19 encounter (Appointment) with Josue Hector, MD.     Allergies:   Patient has no known allergies.   Social History   Tobacco Use  . Smoking status: Never Smoker  . Smokeless tobacco: Never Used  Substance Use Topics  . Alcohol use: No  . Drug use: No     Family Hx: The patient's family history includes Alzheimer's disease in her mother; Stroke in her father.  ROS:   Please see the history of present illness.     All other systems  reviewed and are negative.   Prior CV studies:   The following studies were reviewed today:  Echo 11/27/18  Labs/Other Tests and Data Reviewed:    EKG:  SR rate 68 normal no LVH 11/21/17  Recent Labs: No results found for requested labs within last 8760 hours.   Recent Lipid Panel No results found for: CHOL, TRIG, HDL, CHOLHDL, LDLCALC, LDLDIRECT  Wt Readings from Last 3 Encounters:  12/06/18 164 lb (74.4 kg)  11/21/17 166 lb (75.3 kg)  11/18/16 171 lb 12.8 oz (77.9 kg)     Objective:    Vital Signs:  There were no vitals taken for this visit.   Affect appropriate Healthy:  appears stated age 67: normal Neck supple with no adenopathy JVP normal no bruits no thyromegaly Lungs clear with no wheezing and good diaphragmatic motion Heart:  S1/S2 preserved moderate AS murmur, no rub, gallop or click PMI normal Abdomen: benighn, BS positve, no tenderness, no AAA no bruit.  No HSM or HJR Distal pulses intact with no bruits No edema Neuro non-focal Skin warm and dry No muscular weakness   ASSESSMENT & PLAN:    AR/AS:  Moderate AS normal EF Echo today 12/24/19 stable mean gradient 22 mmHg  HTN:  Well controlled.  Continue current medications and low sodium Dash type diet.   Thyroid:  On  replacement TSH with primary in [place] dose recently decreased  ChoL  On statin Cholesterol is at goal.  Continue current dose of statin and diet Rx.  No myalgias or side effects.  F/U  Labs with primary   COVID-19 Education: The signs and symptoms of COVID-19 were discussed with the patient and how to seek care for testing (follow up with PCP or arrange E-visit).  The importance of social distancing was discussed today.    Medication Adjustments/Labs and Tests Ordered: Current medicines are reviewed at length with the patient today.  Concerns regarding medicines are outlined above.   Tests Ordered: No orders of the defined types were placed in this encounter. Echo for AS    Medication Changes: No orders of the defined types were placed in this encounter.   Disposition:  Follow up in a year with echo for AS if this year's stable   Signed, Charlton Haws, MD  12/24/2019 11:23 AM    [facility]

## 2019-12-24 ENCOUNTER — Other Ambulatory Visit: Payer: Self-pay

## 2019-12-24 ENCOUNTER — Ambulatory Visit: Payer: Medicare Other | Admitting: Cardiovascular Disease

## 2019-12-24 ENCOUNTER — Ambulatory Visit (HOSPITAL_COMMUNITY): Payer: Medicare Other | Attending: Cardiovascular Disease

## 2019-12-24 ENCOUNTER — Encounter: Payer: Self-pay | Admitting: Cardiovascular Disease

## 2019-12-24 VITALS — BP 132/72 | HR 57 | Ht 63.0 in | Wt 150.0 lb

## 2019-12-24 DIAGNOSIS — I350 Nonrheumatic aortic (valve) stenosis: Secondary | ICD-10-CM

## 2019-12-24 DIAGNOSIS — E785 Hyperlipidemia, unspecified: Secondary | ICD-10-CM

## 2019-12-24 DIAGNOSIS — I10 Essential (primary) hypertension: Secondary | ICD-10-CM

## 2019-12-24 NOTE — Patient Instructions (Addendum)
Medication Instructions:  *If you need a refill on your cardiac medications before your next appointment, please call your pharmacy*  Lab Work: If you have labs (blood work) drawn today and your tests are completely normal, you will receive your results only by: . MyChart Message (if you have MyChart) OR . A paper copy in the mail If you have any lab test that is abnormal or we need to change your treatment, we will call you to review the results.  Testing/Procedures: Your physician has requested that you have an echocardiogram in one year. Echocardiography is a painless test that uses sound waves to create images of your heart. It provides your doctor with information about the size and shape of your heart and how well your heart's chambers and valves are working. This procedure takes approximately one hour. There are no restrictions for this procedure.  Follow-Up: At [facility], you and your health needs are our priority.  As part of our continuing mission to provide you with exceptional heart care, we have created designated Provider Care Teams.  These Care Teams include your primary Cardiologist (physician) and Advanced Practice Providers (APPs -  Physician Assistants and Nurse Practitioners) who all work together to provide you with the care you need, when you need it.  We recommend signing up for the patient portal called "MyChart".  Sign up information is provided on this After Visit Summary.  MyChart is used to connect with patients for Virtual Visits (Telemedicine).  Patients are able to view lab/test results, encounter notes, upcoming appointments, etc.  Non-urgent messages can be sent to your provider as well.   To learn more about what you can do with MyChart, go to [email or web address].    Your next appointment:   12 month(s)  The format for your next appointment:   In Person  Provider:   You may see Dr. Nishan or one of the following Advanced Practice Providers on your  designated Care Team:    Lori Gerhardt, NP  Laura Ingold, NP  Jill McDaniel, NP     

## 2020-03-11 DIAGNOSIS — D225 Melanocytic nevi of trunk: Secondary | ICD-10-CM | POA: Diagnosis not present

## 2020-03-11 DIAGNOSIS — L814 Other melanin hyperpigmentation: Secondary | ICD-10-CM | POA: Diagnosis not present

## 2020-03-11 DIAGNOSIS — D2239 Melanocytic nevi of other parts of face: Secondary | ICD-10-CM | POA: Diagnosis not present

## 2020-03-11 DIAGNOSIS — L578 Other skin changes due to chronic exposure to nonionizing radiation: Secondary | ICD-10-CM | POA: Diagnosis not present

## 2020-03-25 DIAGNOSIS — I10 Essential (primary) hypertension: Secondary | ICD-10-CM | POA: Diagnosis not present

## 2020-03-25 DIAGNOSIS — E7800 Pure hypercholesterolemia, unspecified: Secondary | ICD-10-CM | POA: Diagnosis not present

## 2020-03-25 DIAGNOSIS — E039 Hypothyroidism, unspecified: Secondary | ICD-10-CM | POA: Diagnosis not present

## 2020-03-27 DIAGNOSIS — E7800 Pure hypercholesterolemia, unspecified: Secondary | ICD-10-CM | POA: Diagnosis not present

## 2020-03-27 DIAGNOSIS — E039 Hypothyroidism, unspecified: Secondary | ICD-10-CM | POA: Diagnosis not present

## 2020-03-27 DIAGNOSIS — M810 Age-related osteoporosis without current pathological fracture: Secondary | ICD-10-CM | POA: Diagnosis not present

## 2020-03-27 DIAGNOSIS — I10 Essential (primary) hypertension: Secondary | ICD-10-CM | POA: Diagnosis not present

## 2020-11-04 ENCOUNTER — Ambulatory Visit: Payer: Medicare Other | Admitting: Orthopaedic Surgery

## 2020-11-04 ENCOUNTER — Ambulatory Visit: Payer: Self-pay

## 2020-11-04 ENCOUNTER — Encounter: Payer: Self-pay | Admitting: Orthopaedic Surgery

## 2020-11-04 VITALS — BP 177/86 | HR 97 | Ht 63.0 in | Wt 150.0 lb

## 2020-11-04 DIAGNOSIS — M1611 Unilateral primary osteoarthritis, right hip: Secondary | ICD-10-CM | POA: Diagnosis not present

## 2020-11-04 DIAGNOSIS — M25551 Pain in right hip: Secondary | ICD-10-CM

## 2020-11-04 NOTE — Progress Notes (Signed)
Office Visit Note   Patient: Holly Patrick           Date of Birth: Sep 17, 1945           MRN: [id number] Visit Date: 11/04/2020              Requested by: Lonie Peak, PA-C [id number] [place],  [place] [address] PCP: Lonie Peak, PA-C   Assessment & Plan: Visit Diagnoses:  1. Pain in right hip   2. Unilateral primary osteoarthritis, right hip     Plan: Patient has been on anti-inflammatories progressive symptoms for greater than 6 months bone-on-bone changes in the right hip.  She will require right total of arthroplasty.  We discussed direct anterior approach, overnight stay use a walker postoperatively.  She would not need any home physical therapy.  Daughter would be available for postop care.  We discussed the surgical approach, spinal anesthesia risks of surgery, postop activity.  Questions were elicited and answered.  She understands and requests we proceed.  Follow-Up Instructions: No follow-ups on file.   Orders:  Orders Placed This Encounter  Procedures  . XR HIP UNILAT W OR W/O PELVIS 2-3 VIEWS RIGHT   No orders of the defined types were placed in this encounter.     Procedures: No procedures performed   Clinical Data: No additional findings.   Subjective: Chief Complaint  Patient presents with  . Right Hip - Pain    HPI 75 year old female referred to me by Lonie Peak, PA-C for right total of arthroplasty.  Patient is here with her daughter and patient's husband was long-term patient of mine who passed away last year.  Patient's x-rays in March last year showed bone-on-bone right hip with normal left hip.  Patient's been on meloxicam takes aspirin daily also used Tylenol with progressive hip pain.  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  Patient does have osteoporosis and has been on Fosamax for about 4 years without any interval breaks.  She has bone density  coming up next week.  She takes Synthroid for thyroid supplement also Lipitor for cholesterol and medication for blood pressure.  Negative for heart attack or stroke she has been active and healthy.  She had delayed coming for surgical evaluation for many months when she took care of her husband.  Review of Systems positive for hypertension osteoporosis, high cholesterol and hypothyroidism as listed above all other systems noncontributory to HPI.   Objective: Vital Signs: BP (!) 177/86   Pulse 97   Ht 5\' 3"  (1.6 m)   Wt 150 lb (68 kg)   BMI 26.57 kg/m   Physical Exam Constitutional:      Appearance: She is well-developed.  HENT:     Head: Normocephalic.     Right Ear: External ear normal.     Left Ear: External ear normal.  Eyes:     Pupils: Pupils are equal, round, and reactive to light.  Neck:     Thyroid: No thyromegaly.     Trachea: No tracheal deviation.  Cardiovascular:     Rate and Rhythm: Normal rate.  Pulmonary:     Effort: Pulmonary effort is normal.  Abdominal:     Palpations: Abdomen is soft.  Skin:    General: Skin is warm and dry.  Neurological:     Mental Status: She is alert and oriented to person, place, and time.  Psychiatric:        Behavior: Behavior normal.     Ortho Exam patient has positive Trendelenburg limp on the right hip internal rotation limited to only 15 degrees right hip and opposite left internally rotates 30-40.  Sharp pain with FABER test on the right.  No hip flexion contracture.  Knee range of motion is full distal pulses are 2+.  No trochanteric bursal tenderness. Specialty Comments:  No specialty comments available.  Imaging: XR HIP UNILAT W OR W/O PELVIS 2-3 VIEWS RIGHT  Result Date: 11/04/2020 Standing AP pelvis frog-leg lateral right hip obtained and reviewed.  This shows normal left hip with bone-on-bone changes right hip subchondral sclerosis with marginal osteophytes. Impression: Unilateral right hip severe  osteoarthritis    PMFS History: Patient Active Problem List   Diagnosis Date Noted  . Unilateral primary osteoarthritis, right hip 11/04/2020  . Murmur 10/09/2012  . HTN (hypertension) 10/09/2012   Past Medical History:  Diagnosis Date  . Osteoporosis   . Sleep apnea     Family History  Problem Relation Age of Onset  . Alzheimer's disease Mother   . Stroke Father     Past Surgical History:  Procedure Laterality Date  . PARTIAL HYSTERECTOMY  1980's   Social History   Occupational History  . Not on file  Tobacco Use  . Smoking status: Never Smoker  . Smokeless tobacco: Never Used  Substance and Sexual Activity  . Alcohol use: No  . Drug use: No  . Sexual activity: Not on file

## 2020-11-05 ENCOUNTER — Other Ambulatory Visit: Payer: Self-pay

## 2020-11-20 ENCOUNTER — Ambulatory Visit (INDEPENDENT_AMBULATORY_CARE_PROVIDER_SITE_OTHER): Payer: Medicare Other | Admitting: Surgery

## 2020-11-20 ENCOUNTER — Encounter: Payer: Self-pay | Admitting: Surgery

## 2020-11-20 VITALS — BP 182/73 | HR 75 | Ht 63.0 in | Wt 150.0 lb

## 2020-11-20 DIAGNOSIS — M1611 Unilateral primary osteoarthritis, right hip: Secondary | ICD-10-CM

## 2020-11-20 NOTE — Progress Notes (Signed)
  75 year old white female history of end-stage DJD right hip and pain comes in for preop evaluation.  States that hip symptoms unchanged from previous visit and she is wanting to proceed with right total hip replacement as scheduled.  Today history and physical performed.  Review of systems negative.  Dr. Ophelia Charter did not request preop medical or cardiac clearance.  Patient does see cardiologist Dr. Charlton Haws for history of aortic valve insufficiency/stenosis.  Patient denies lightheadedness dizziness, syncope, chest pain, shortness of breath.  She has a loud systolic murmur on exam.  Surgery procedure discussed in great detail with patient and family member who was present.  All questions were answered.  Patient and family member would like home health PT immediately postop.  I will proceed with arranging this.

## 2020-11-21 ENCOUNTER — Telehealth: Payer: Self-pay | Admitting: *Deleted

## 2020-11-21 NOTE — Telephone Encounter (Signed)
   [facility] Pre-operative Risk Assessment    Patient Name: Holly Patrick  DOB: 10-16-1945  MRN: [id number]   HEARTCARE STAFF: - Please ensure there is not already an duplicate clearance open for this procedure. - Under Visit Info/Reason for Call, type in Other and utilize the format Clearance MM/DD/YY or Clearance TBD. Do not use dashes or single digits. - If request is for dental extraction, please clarify the # of teeth to be extracted.  Request for surgical clearance:  1. What type of surgery is being performed? RIGHT TOTAL HIP ARTHROPLASTY   2. When is this surgery scheduled? 11/28/20   3. What type of clearance is required (medical clearance vs. Pharmacy clearance to hold med vs. Both)? MEDICAL  4. Are there any medications that need to be held prior to surgery and how long? ASA   5. Practice name and name of physician performing surgery? [facility]; DR. Blackey   6. What is the office phone number? [phone]   7.   What is the office fax number? [facility]   Anesthesia type (None, local, MAC, general) ? SPINAL   Julaine Hua 11/21/2020, 4:59 PM  _________________________________________________________________   (provider comments below)

## 2020-11-21 NOTE — Telephone Encounter (Signed)
ADDENDUM: CLEARANCE REQUEST STATES URGENT

## 2020-11-21 NOTE — Telephone Encounter (Signed)
   Name: Holly Patrick  DOB: January 04, 1946  MRN: [id number]   Primary Cardiologist: None  Chart reviewed as part of pre-operative protocol coverage. Patient was contacted 11/21/2020 in reference to pre-operative risk assessment for pending surgery as outlined below.  Holly Patrick was last seen on 12/23/20 by Dr. Eden Emms.  Since that day, Holly Patrick has done well from a cardiac standpoint. Her hip pain has slowed her down a bit but she continues to stay quite active and can easily complete 4 METs without anginal complaints.   Therefore, based on ACC/AHA guidelines, the patient would be at acceptable risk for the planned procedure without further cardiovascular testing. That being said she is due for her annual echocardiogram 11/24/20 to monitor her moderate aortic stenosis. She does not describe any symptoms c/f significant progression of disease, though given this will occur prior to her surgery date, favor following up on her echo results before granting our final blessing. Will as preop team to follow-up on echo results 11/24/20.   Patient can hold aspirin 7 days prior to her upcoming surgery with plans to restart when cleared to do so by her orthopedist.   Beatriz Stallion, PA-C 11/21/2020, 5:33 PM

## 2020-11-24 ENCOUNTER — Ambulatory Visit (HOSPITAL_COMMUNITY): Payer: Medicare Other | Attending: Cardiology

## 2020-11-24 ENCOUNTER — Other Ambulatory Visit: Payer: Self-pay

## 2020-11-24 DIAGNOSIS — I350 Nonrheumatic aortic (valve) stenosis: Secondary | ICD-10-CM

## 2020-11-24 DIAGNOSIS — E785 Hyperlipidemia, unspecified: Secondary | ICD-10-CM | POA: Diagnosis not present

## 2020-11-24 DIAGNOSIS — I10 Essential (primary) hypertension: Secondary | ICD-10-CM

## 2020-11-24 LAB — ECHOCARDIOGRAM COMPLETE
AR max vel: 1.05 cm2
AV Area VTI: 1.07 cm2
AV Area mean vel: 0.99 cm2
AV Mean grad: 27.4 mmHg
AV Peak grad: 47.2 mmHg
Ao pk vel: 3.43 m/s
Area-P 1/2: 2.18 cm2
S' Lateral: 1.7 cm

## 2020-11-24 NOTE — Progress Notes (Signed)
Surgical Instructions    Your procedure is scheduled on 11/28/20.  Report to [facility] Main Entrance "A" at 05:45 A.M., then check in with the Admitting office.  Call this number if you have problems the morning of surgery:  [phone]   If you have any questions prior to your surgery date call [phone]: Open Monday-Friday 8am-4pm    Remember:  Do not eat after midnight the night before your surgery  You may drink clear liquids until 04:45am the morning of your surgery.   Clear liquids allowed are: Water, Non-Citrus Juices (without pulp), Carbonated Beverages, Clear Tea, Black Coffee Only, and Gatorade  .Patient Instructions  . The night before surgery:  o No food after midnight. ONLY clear liquids after midnight  . The day of surgery (if you do NOT have diabetes):  o Drink ONE (1) Pre-Surgery Clear Ensure by 04:45am the morning of surgery. Drink in one sitting. Do not sip.  o This drink was given to you during your hospital  pre-op appointment visit. o Nothing else to drink after completing the  Pre-Surgery Clear Ensure.           If you have questions, please contact your surgeon's office.     Take these medicines the morning of surgery with A SIP OF WATER  amLODipine (NORVASC)  atorvastatin (LIPITOR) levothyroxine (SYNTHROID)  As of today, STOP taking any  (unless otherwise instructed by your surgeon) Aleve, Naproxen, Ibuprofen, Motrin, Advil, Goody's, BC's, all herbal medications, fish oil, and all vitamins. Hold Aspirin for 7 days prior to surgery per your cardiologist.              Do not wear jewelry, make up, or nail polish            Do not wear lotions, powders, perfumes, or deodorant.            Do not shave 48 hours prior to surgery.              Do not bring valuables to the hospital.            [facility] is not responsible for any belongings or valuables.  Do NOT Smoke (Tobacco/Vaping) or drink Alcohol 24 hours prior to your procedure If you use a  CPAP at night, you may bring all equipment for your overnight stay.   Contacts, glasses, dentures or bridgework may not be worn into surgery, please bring cases for these belongings   For patients admitted to the hospital, discharge time will be determined by your treatment team.   Patients discharged the day of surgery will not be allowed to drive home, and someone needs to stay with them for 24 hours.    Special instructions:   [facility]- Preparing For Surgery  Before surgery, you can play an important role. Because skin is not sterile, your skin needs to be as free of germs as possible. You can reduce the number of germs on your skin by washing with CHG (chlorahexidine gluconate) Soap before surgery.  CHG is an antiseptic cleaner which kills germs and bonds with the skin to continue killing germs even after washing.    Oral Hygiene is also important to reduce your risk of infection.  Remember - BRUSH YOUR TEETH THE MORNING OF SURGERY WITH YOUR REGULAR TOOTHPASTE  Please do not use if you have an allergy to CHG or antibacterial soaps. If your skin becomes reddened/irritated stop using the CHG.  Do not shave (including legs and  underarms) for at least 48 hours prior to first CHG shower. It is OK to shave your face.  Please follow these instructions carefully.   1. Shower the NIGHT BEFORE SURGERY and the MORNING OF SURGERY  2. If you chose to wash your hair, wash your hair first as usual with your normal shampoo.  3. After you shampoo, rinse your hair and body thoroughly to remove the shampoo.  4. Wash Face and genitals (private parts) with your normal soap.   5.  Shower the NIGHT BEFORE SURGERY and the MORNING OF SURGERY with CHG Soap.   6. Use CHG Soap as you would any other liquid soap. You can apply CHG directly to the skin and wash gently with a scrungie or a clean washcloth.   7. Apply the CHG Soap to your body ONLY FROM THE NECK DOWN.  Do not use on open wounds or open  sores. Avoid contact with your eyes, ears, mouth and genitals (private parts). Wash Face and genitals (private parts)  with your normal soap.   8. Wash thoroughly, paying special attention to the area where your surgery will be performed.  9. Thoroughly rinse your body with warm water from the neck down.  10. DO NOT shower/wash with your normal soap after using and rinsing off the CHG Soap.  11. Pat yourself dry with a CLEAN TOWEL.  12. Wear CLEAN PAJAMAS to bed the night before surgery  13. Place CLEAN SHEETS on your bed the night before your surgery  14. DO NOT SLEEP WITH PETS.   Day of Surgery: Take a shower with CHG soap. Wear Clean/Comfortable clothing the morning of surgery Do not apply any deodorants/lotions.   Remember to brush your teeth WITH YOUR REGULAR TOOTHPASTE.   Please read over the following fact sheets that you were given.

## 2020-11-25 ENCOUNTER — Encounter (HOSPITAL_COMMUNITY): Payer: Self-pay | Admitting: *Deleted

## 2020-11-25 ENCOUNTER — Encounter (HOSPITAL_COMMUNITY)
Admission: RE | Admit: 2020-11-25 | Discharge: 2020-11-25 | Disposition: A | Discharge status: Home / Self Care | Payer: Medicare Other | Source: Ambulatory Visit | Attending: Orthopaedic Surgery | Admitting: Orthopaedic Surgery

## 2020-11-25 ENCOUNTER — Encounter (HOSPITAL_COMMUNITY)
Admission: RE | Admit: 2020-11-25 | Discharge: 2020-11-25 | Disposition: A | Discharge status: Home / Self Care | Payer: Medicare Other | Source: Ambulatory Visit | Attending: Surgery | Admitting: Surgery

## 2020-11-25 ENCOUNTER — Other Ambulatory Visit: Payer: Self-pay

## 2020-11-25 DIAGNOSIS — Z20822 Contact with and (suspected) exposure to covid-19: Secondary | ICD-10-CM | POA: Insufficient documentation

## 2020-11-25 DIAGNOSIS — Z01818 Encounter for other preprocedural examination: Secondary | ICD-10-CM | POA: Diagnosis not present

## 2020-11-25 DIAGNOSIS — I10 Essential (primary) hypertension: Secondary | ICD-10-CM | POA: Diagnosis not present

## 2020-11-25 HISTORY — DX: Essential (primary) hypertension: I10

## 2020-11-25 HISTORY — DX: Unspecified osteoarthritis, unspecified site: M19.90

## 2020-11-25 HISTORY — DX: Hypothyroidism, unspecified: E03.9

## 2020-11-25 LAB — TYPE AND SCREEN
ABO/RH(D): O POS
Antibody Screen: NEGATIVE

## 2020-11-25 LAB — URINALYSIS, ROUTINE W REFLEX MICROSCOPIC
Bilirubin Urine: NEGATIVE
Glucose, UA: NEGATIVE mg/dL
Hgb urine dipstick: NEGATIVE
Ketones, ur: NEGATIVE mg/dL
Leukocytes,Ua: NEGATIVE
Nitrite: NEGATIVE
Protein, ur: NEGATIVE mg/dL
Specific Gravity, Urine: 1.015 (ref 1.005–1.030)
pH: 5 (ref 5.0–8.0)

## 2020-11-25 LAB — CBC
HCT: 40.4 % (ref 36.0–46.0)
Hemoglobin: 13.4 g/dL (ref 12.0–15.0)
MCH: 31 pg (ref 26.0–34.0)
MCHC: 33.2 g/dL (ref 30.0–36.0)
MCV: 93.5 fL (ref 80.0–100.0)
Platelets: 335 10*3/uL (ref 150–400)
RBC: 4.32 MIL/uL (ref 3.87–5.11)
RDW: 12.6 % (ref 11.5–15.5)
WBC: 8.5 10*3/uL (ref 4.0–10.5)
nRBC: 0 % (ref 0.0–0.2)

## 2020-11-25 LAB — COMPREHENSIVE METABOLIC PANEL
ALT: 20 U/L (ref 0–44)
AST: 23 U/L (ref 15–41)
Albumin: 4.3 g/dL (ref 3.5–5.0)
Alkaline Phosphatase: 51 U/L (ref 38–126)
Anion gap: 11 (ref 5–15)
BUN: 13 mg/dL (ref 8–23)
CO2: 24 mmol/L (ref 22–32)
Calcium: 9.4 mg/dL (ref 8.9–10.3)
Chloride: 100 mmol/L (ref 98–111)
Creatinine, Ser: 0.83 mg/dL (ref 0.44–1.00)
GFR, Estimated: >60 mL/min (ref >60)
Glucose, Bld: 96 mg/dL (ref 70–99)
Potassium: 3.7 mmol/L (ref 3.5–5.1)
Sodium: 135 mmol/L (ref 135–145)
Total Bilirubin: 0.9 mg/dL (ref 0.3–1.2)
Total Protein: 7.3 g/dL (ref 6.5–8.1)

## 2020-11-25 LAB — SARS CORONAVIRUS 2 (TAT 6-24 HRS): SARS Coronavirus 2: NEGATIVE

## 2020-11-25 LAB — SURGICAL PCR SCREEN
MRSA, PCR: NEGATIVE
Staphylococcus aureus: NEGATIVE

## 2020-11-25 NOTE — Progress Notes (Signed)
PCP - Lonie Peak, PA-C Cardiologist - Charlton Haws  PPM/ICD - denies   Chest x-ray - 11/25/20 EKG - 11/25/20 Stress Test - denies ECHO - 11/24/20 Cardiac Cath -denies   Sleep Study - 2007 at [facility] (records requested) CPAP - does not wear anymore, lost weight and no longer needs to wear it  No diabetes  Patient instructed to hold all Aspirin, NSAID's, herbal medications, fish oil and vitamins 7 days prior to surgery.   ERAS Protcol -yes PRE-SURGERY Ensure or G2- ensure given  COVID TEST- 11/25/20   Anesthesia review: no  Patient denies shortness of breath, fever, cough and chest pain at PAT appointment   All instructions explained to the patient, with a verbal understanding of the material. Patient agrees to go over the instructions while at home for a better understanding. Patient also instructed to self quarantine after being tested for COVID-19. The opportunity to ask questions was provided.

## 2020-11-26 NOTE — Telephone Encounter (Signed)
    Holly Patrick DOB:  Apr 03, 1946  MRN:  [id number]   Primary Cardiologist: Charlton Haws, MD  Chart reviewed as part of pre-operative protocol coverage. Echocardiogram 11/24/20 showed stable moderate aortic stenosis. Given past medical history and time since last visit, based on ACC/AHA guidelines, Holly Patrick would be at acceptable risk for the planned procedure without further cardiovascular testing.   Patient can hold aspirin 7 days prior to her upcoming surgery with plans to restart when cleared to do so by her orthopedist.   I will route this recommendation to the requesting party via Epic fax function and remove from pre-op pool.  Please call with questions.  Beatriz Stallion, PA-C 11/26/2020, 9:42 AM

## 2020-11-27 NOTE — Anesthesia Preprocedure Evaluation (Addendum)
Anesthesia Evaluation  Patient identified by MRN, date of birth, ID band Patient awake    Reviewed: Allergy & Precautions, NPO status , Patient's Chart, lab work & pertinent test results  Airway Mallampati: I       Dental no notable dental hx.    Pulmonary    Pulmonary exam normal        Cardiovascular hypertension, Pt. on medications Normal cardiovascular exam     Neuro/Psych negative neurological ROS  negative psych ROS   GI/Hepatic negative GI ROS, Neg liver ROS,   Endo/Other  Hypothyroidism   Renal/GU negative Renal ROS  negative genitourinary   Musculoskeletal  (+) Arthritis , Osteoarthritis,    Abdominal Normal abdominal exam  (+)   Peds negative pediatric ROS (+)  Hematology negative hematology ROS (+)   Anesthesia Other Findings   Reproductive/Obstetrics                            Anesthesia Physical Anesthesia Plan  ASA: II  Anesthesia Plan: Spinal   Post-op Pain Management:    Induction:   PONV Risk Score and Plan: Ondansetron and Dexamethasone  Airway Management Planned:   Additional Equipment: None  Intra-op Plan:   Post-operative Plan:   Informed Consent: I have reviewed the patients History and Physical, chart, labs and discussed the procedure including the risks, benefits and alternatives for the proposed anesthesia with the patient or authorized representative who has indicated his/her understanding and acceptance.       Plan Discussed with: CRNA  Anesthesia Plan Comments:        Anesthesia Quick Evaluation

## 2020-11-28 ENCOUNTER — Encounter (HOSPITAL_COMMUNITY)
Admission: RE | Disposition: A | Discharge status: Home / Self Care | Payer: Self-pay | Source: Home / Self Care | Attending: Orthopaedic Surgery

## 2020-11-28 ENCOUNTER — Other Ambulatory Visit: Payer: Self-pay

## 2020-11-28 ENCOUNTER — Encounter (HOSPITAL_COMMUNITY): Payer: Self-pay | Admitting: Orthopaedic Surgery

## 2020-11-28 ENCOUNTER — Ambulatory Visit (HOSPITAL_COMMUNITY): Payer: Medicare Other | Admitting: Anesthesiology

## 2020-11-28 ENCOUNTER — Ambulatory Visit (HOSPITAL_COMMUNITY): Payer: Medicare Other

## 2020-11-28 ENCOUNTER — Observation Stay (HOSPITAL_COMMUNITY)
Admission: RE | Admit: 2020-11-28 | Discharge: 2020-11-29 | Disposition: A | Discharge status: Home / Self Care | Payer: Medicare Other | Attending: Orthopaedic Surgery | Admitting: Orthopaedic Surgery

## 2020-11-28 ENCOUNTER — Observation Stay (HOSPITAL_COMMUNITY): Payer: Medicare Other

## 2020-11-28 DIAGNOSIS — E039 Hypothyroidism, unspecified: Secondary | ICD-10-CM | POA: Insufficient documentation

## 2020-11-28 DIAGNOSIS — Z09 Encounter for follow-up examination after completed treatment for conditions other than malignant neoplasm: Secondary | ICD-10-CM

## 2020-11-28 DIAGNOSIS — I10 Essential (primary) hypertension: Secondary | ICD-10-CM | POA: Insufficient documentation

## 2020-11-28 DIAGNOSIS — Z419 Encounter for procedure for purposes other than remedying health state, unspecified: Secondary | ICD-10-CM

## 2020-11-28 DIAGNOSIS — Z96641 Presence of right artificial hip joint: Secondary | ICD-10-CM | POA: Diagnosis not present

## 2020-11-28 DIAGNOSIS — M1611 Unilateral primary osteoarthritis, right hip: Secondary | ICD-10-CM | POA: Diagnosis not present

## 2020-11-28 DIAGNOSIS — Z471 Aftercare following joint replacement surgery: Secondary | ICD-10-CM | POA: Diagnosis not present

## 2020-11-28 DIAGNOSIS — M810 Age-related osteoporosis without current pathological fracture: Secondary | ICD-10-CM | POA: Diagnosis not present

## 2020-11-28 HISTORY — PX: TOTAL HIP ARTHROPLASTY: SHX124

## 2020-11-28 LAB — ABO/RH: ABO/RH(D): O POS

## 2020-11-28 SURGERY — ARTHROPLASTY, HIP, TOTAL, ANTERIOR APPROACH
Anesthesia: Spinal | Site: Hip | Laterality: Right

## 2020-11-28 MED ORDER — ORAL CARE MOUTH RINSE: 15.0000 mL | Freq: Once | OROMUCOSAL | Status: AC

## 2020-11-28 MED ORDER — POLYETHYLENE GLYCOL 3350 17 G PO PACK: 17.0000 g | PACK | Freq: Every day | ORAL | Status: DC | PRN

## 2020-11-28 MED ORDER — ACETAMINOPHEN 325 MG PO TABS
325.0000 mg | ORAL_TABLET | Freq: Four times a day (QID) | ORAL | Status: DC | PRN
Start: 2020-11-29 — End: 2020-11-29

## 2020-11-28 MED ORDER — CHLORHEXIDINE GLUCONATE 0.12 % MT SOLN
OROMUCOSAL | Status: AC
  Administered 2020-11-28: 15 mL via OROMUCOSAL
  Filled 2020-11-28: qty 15

## 2020-11-28 MED ORDER — BUPIVACAINE LIPOSOME 1.3 % IJ SUSP
INTRAMUSCULAR | Status: AC
  Filled 2020-11-28: qty 20

## 2020-11-28 MED ORDER — TRANEXAMIC ACID-NACL 1000-0.7 MG/100ML-% IV SOLN
INTRAVENOUS | Status: AC
  Filled 2020-11-28: qty 100

## 2020-11-28 MED ORDER — OXYCODONE-ACETAMINOPHEN 5-325 MG PO TABS: 1.0000 | ORAL_TABLET | Freq: Four times a day (QID) | ORAL | 0 refills | Status: DC | PRN

## 2020-11-28 MED ORDER — ACETAMINOPHEN 10 MG/ML IV SOLN
1000.0000 mg | Freq: Once | INTRAVENOUS | Status: DC | PRN
  Administered 2020-11-28: 1000 mg via INTRAVENOUS

## 2020-11-28 MED ORDER — FENTANYL CITRATE (PF) 100 MCG/2ML IJ SOLN
INTRAMUSCULAR | Status: AC
  Filled 2020-11-28: qty 2

## 2020-11-28 MED ORDER — PHENOL 1.4 % MT LIQD: 1.0000 | OROMUCOSAL | Status: DC | PRN

## 2020-11-28 MED ORDER — OXYCODONE HCL 5 MG PO TABS
5.0000 mg | ORAL_TABLET | ORAL | Status: DC | PRN
  Administered 2020-11-28 – 2020-11-29 (×2): 5 mg via ORAL
  Filled 2020-11-28 (×2): qty 1

## 2020-11-28 MED ORDER — BUPIVACAINE HCL (PF) 0.25 % IJ SOLN
INTRAMUSCULAR | Status: AC
  Filled 2020-11-28: qty 30

## 2020-11-28 MED ORDER — METHOCARBAMOL 500 MG PO TABS: 500.0000 mg | ORAL_TABLET | Freq: Four times a day (QID) | ORAL | 0 refills | Status: DC | PRN

## 2020-11-28 MED ORDER — MIDAZOLAM HCL 2 MG/2ML IJ SOLN
INTRAMUSCULAR | Status: DC | PRN
  Administered 2020-11-28: 2 mg via INTRAVENOUS

## 2020-11-28 MED ORDER — FENTANYL CITRATE (PF) 250 MCG/5ML IJ SOLN
INTRAMUSCULAR | Status: AC
  Filled 2020-11-28: qty 5

## 2020-11-28 MED ORDER — PROPOFOL 10 MG/ML IV BOLUS
INTRAVENOUS | Status: AC
  Filled 2020-11-28: qty 20

## 2020-11-28 MED ORDER — TRANEXAMIC ACID 1000 MG/10ML IV SOLN
INTRAVENOUS | Status: AC | PRN
  Administered 2020-11-28: 1000 mg via INTRAVENOUS

## 2020-11-28 MED ORDER — MEPERIDINE HCL 25 MG/ML IJ SOLN: 6.2500 mg | INTRAMUSCULAR | Status: DC | PRN

## 2020-11-28 MED ORDER — ONDANSETRON HCL 4 MG/2ML IJ SOLN: 4.0000 mg | Freq: Once | INTRAMUSCULAR | Status: DC | PRN

## 2020-11-28 MED ORDER — ACETAMINOPHEN 10 MG/ML IV SOLN
INTRAVENOUS | Status: AC
  Filled 2020-11-28: qty 100

## 2020-11-28 MED ORDER — BUPIVACAINE-EPINEPHRINE 0.25% -1:200000 IJ SOLN
INTRAMUSCULAR | Status: DC | PRN
  Administered 2020-11-28: 10 mL

## 2020-11-28 MED ORDER — METHOCARBAMOL 500 MG PO TABS: 500.0000 mg | ORAL_TABLET | Freq: Four times a day (QID) | ORAL | Status: DC | PRN

## 2020-11-28 MED ORDER — FENTANYL CITRATE (PF) 250 MCG/5ML IJ SOLN
INTRAMUSCULAR | Status: DC | PRN
  Administered 2020-11-28: 50 ug via INTRAVENOUS

## 2020-11-28 MED ORDER — METOCLOPRAMIDE HCL 5 MG PO TABS: 5.0000 mg | ORAL_TABLET | Freq: Three times a day (TID) | ORAL | Status: DC | PRN

## 2020-11-28 MED ORDER — PROPOFOL 500 MG/50ML IV EMUL
INTRAVENOUS | Status: DC | PRN
  Administered 2020-11-28: 20 ug/kg/min via INTRAVENOUS

## 2020-11-28 MED ORDER — DOCUSATE SODIUM 100 MG PO CAPS
100.0000 mg | ORAL_CAPSULE | Freq: Two times a day (BID) | ORAL | Status: DC
  Administered 2020-11-28: 100 mg via ORAL
  Filled 2020-11-28 (×3): qty 1

## 2020-11-28 MED ORDER — CEFAZOLIN SODIUM-DEXTROSE 2-4 GM/100ML-% IV SOLN
2.0000 g | INTRAVENOUS | Status: AC
  Administered 2020-11-28: 2 g via INTRAVENOUS

## 2020-11-28 MED ORDER — SODIUM CHLORIDE 0.9 % IV SOLN: INTRAVENOUS | Status: DC

## 2020-11-28 MED ORDER — HYDROMORPHONE HCL 1 MG/ML IJ SOLN
0.5000 mg | INTRAMUSCULAR | Status: DC | PRN
  Filled 2020-11-28: qty 0.5

## 2020-11-28 MED ORDER — ONDANSETRON HCL 4 MG PO TABS: 4.0000 mg | ORAL_TABLET | Freq: Four times a day (QID) | ORAL | Status: DC | PRN

## 2020-11-28 MED ORDER — ONDANSETRON HCL 4 MG/2ML IJ SOLN: 4.0000 mg | Freq: Four times a day (QID) | INTRAMUSCULAR | Status: DC | PRN

## 2020-11-28 MED ORDER — LACTATED RINGERS IV SOLN: INTRAVENOUS | Status: DC

## 2020-11-28 MED ORDER — ASPIRIN EC 325 MG PO TBEC
325.0000 mg | DELAYED_RELEASE_TABLET | Freq: Every day | ORAL | Status: DC
  Administered 2020-11-29: 325 mg via ORAL
  Filled 2020-11-28: qty 1

## 2020-11-28 MED ORDER — CEFAZOLIN SODIUM-DEXTROSE 2-4 GM/100ML-% IV SOLN
INTRAVENOUS | Status: AC
  Filled 2020-11-28: qty 100

## 2020-11-28 MED ORDER — BUPIVACAINE IN DEXTROSE 0.75-8.25 % IT SOLN
INTRATHECAL | Status: DC | PRN
  Administered 2020-11-28: 1.5 mL via INTRATHECAL

## 2020-11-28 MED ORDER — MENTHOL 3 MG MT LOZG: 1.0000 | LOZENGE | OROMUCOSAL | Status: DC | PRN

## 2020-11-28 MED ORDER — MIDAZOLAM HCL 2 MG/2ML IJ SOLN
INTRAMUSCULAR | Status: AC
  Filled 2020-11-28: qty 2

## 2020-11-28 MED ORDER — ASPIRIN EC 325 MG PO TBEC: 325.0000 mg | DELAYED_RELEASE_TABLET | Freq: Every day | ORAL | 0 refills | Status: DC

## 2020-11-28 MED ORDER — METOCLOPRAMIDE HCL 5 MG/ML IJ SOLN: 5.0000 mg | Freq: Three times a day (TID) | INTRAMUSCULAR | Status: DC | PRN

## 2020-11-28 MED ORDER — CHLORHEXIDINE GLUCONATE 0.12 % MT SOLN: 15.0000 mL | Freq: Once | OROMUCOSAL | Status: AC

## 2020-11-28 MED ORDER — PHENYLEPHRINE HCL-NACL 10-0.9 MG/250ML-% IV SOLN
INTRAVENOUS | Status: DC | PRN
  Administered 2020-11-28: 20 ug/min via INTRAVENOUS

## 2020-11-28 MED ORDER — BUPIVACAINE LIPOSOME 1.3 % IJ SUSP
INTRAMUSCULAR | Status: DC | PRN
  Administered 2020-11-28: 10 mL

## 2020-11-28 MED ORDER — FENTANYL CITRATE (PF) 100 MCG/2ML IJ SOLN
25.0000 ug | INTRAMUSCULAR | Status: DC | PRN
  Administered 2020-11-28: 50 ug via INTRAVENOUS

## 2020-11-28 MED ORDER — METHOCARBAMOL 1000 MG/10ML IJ SOLN
500.0000 mg | Freq: Four times a day (QID) | INTRAVENOUS | Status: DC | PRN
  Filled 2020-11-28: qty 5

## 2020-11-28 SURGICAL SUPPLY — 57 items
APL SKNCLS STERI-STRIP NONHPOA (GAUZE/BANDAGES/DRESSINGS) ×1
BENZOIN TINCTURE PRP APPL 2/3 (GAUZE/BANDAGES/DRESSINGS) ×2 IMPLANT
BLADE CLIPPER SURG (BLADE) IMPLANT
BLADE SAW SGTL 18X1.27X75 (BLADE) ×2 IMPLANT
CELLS DAT CNTRL 66122 CELL SVR (MISCELLANEOUS) ×1 IMPLANT
COVER SURGICAL LIGHT HANDLE (MISCELLANEOUS) ×2 IMPLANT
COVER WAND RF STERILE (DRAPES) ×2 IMPLANT
DRAPE C-ARM 42X72 X-RAY (DRAPES) ×2 IMPLANT
DRAPE IMP U-DRAPE 54X76 (DRAPES) ×2 IMPLANT
DRAPE STERI IOBAN 125X83 (DRAPES) ×2 IMPLANT
DRAPE U-SHAPE 47X51 STRL (DRAPES) ×6 IMPLANT
DRSG MEPILEX BORDER 4X12 (GAUZE/BANDAGES/DRESSINGS) ×1 IMPLANT
DRSG MEPILEX BORDER 4X8 (GAUZE/BANDAGES/DRESSINGS) ×2 IMPLANT
DURAPREP 26ML APPLICATOR (WOUND CARE) ×2 IMPLANT
ELECT BLADE 4.0 EZ CLEAN MEGAD (MISCELLANEOUS)
ELECT CAUTERY BLADE 6.4 (BLADE) ×2 IMPLANT
ELECT REM PT RETURN 9FT ADLT (ELECTROSURGICAL) ×2
ELECTRODE BLDE 4.0 EZ CLN MEGD (MISCELLANEOUS) IMPLANT
ELECTRODE REM PT RTRN 9FT ADLT (ELECTROSURGICAL) ×1 IMPLANT
ELIMINATOR HOLE APEX DEPUY (Hips) ×1 IMPLANT
FACESHIELD WRAPAROUND (MASK) ×4 IMPLANT
FACESHIELD WRAPAROUND OR TEAM (MASK) ×2 IMPLANT
GLOVE ORTHO TXT STRL SZ7.5 (GLOVE) ×4 IMPLANT
GLOVE SRG 8 PF TXTR STRL LF DI (GLOVE) ×2 IMPLANT
GLOVE SURG UNDER POLY LF SZ8 (GLOVE) ×4
GOWN STRL REUS W/ TWL LRG LVL3 (GOWN DISPOSABLE) ×1 IMPLANT
GOWN STRL REUS W/ TWL XL LVL3 (GOWN DISPOSABLE) ×1 IMPLANT
GOWN STRL REUS W/TWL 2XL LVL3 (GOWN DISPOSABLE) ×2 IMPLANT
GOWN STRL REUS W/TWL LRG LVL3 (GOWN DISPOSABLE) ×2
GOWN STRL REUS W/TWL XL LVL3 (GOWN DISPOSABLE) ×2
HEAD M SROM 36MM PLUS 1.5 (Hips) IMPLANT
KIT BASIN OR (CUSTOM PROCEDURE TRAY) ×2 IMPLANT
KIT TURNOVER KIT B (KITS) ×2 IMPLANT
LINER NEUTRAL 52X36MM PLUS 4 (Liner) ×1 IMPLANT
MANIFOLD NEPTUNE II (INSTRUMENTS) ×2 IMPLANT
NS IRRIG 1000ML POUR BTL (IV SOLUTION) ×2 IMPLANT
PACK TOTAL JOINT (CUSTOM PROCEDURE TRAY) ×2 IMPLANT
PAD ARMBOARD 7.5X6 YLW CONV (MISCELLANEOUS) ×4 IMPLANT
PIN SECTOR W/GRIP ACE CUP 52MM (Hips) ×1 IMPLANT
RETRACTOR WND ALEXIS 18 MED (MISCELLANEOUS) ×1 IMPLANT
RTRCTR WOUND ALEXIS 18CM MED (MISCELLANEOUS) ×2
SCREW PINN CAN 6.5X20 (Screw) ×1 IMPLANT
SROM M HEAD 36MM PLUS 1.5 (Hips) ×2 IMPLANT
STAPLER VISISTAT 35W (STAPLE) ×1 IMPLANT
STEM FEM ACTIS STD SZ7 (Nail) ×1 IMPLANT
STRIP CLOSURE SKIN 1/2X4 (GAUZE/BANDAGES/DRESSINGS) ×2 IMPLANT
SUT VIC AB 0 CT1 27 (SUTURE) ×2
SUT VIC AB 0 CT1 27XBRD ANBCTR (SUTURE) ×1 IMPLANT
SUT VIC AB 2-0 CT1 27 (SUTURE) ×2
SUT VIC AB 2-0 CT1 TAPERPNT 27 (SUTURE) ×1 IMPLANT
SUT VICRYL 4-0 PS2 18IN ABS (SUTURE) ×2 IMPLANT
SUT VLOC 180 0 24IN GS25 (SUTURE) ×2 IMPLANT
TOWEL GREEN STERILE (TOWEL DISPOSABLE) ×4 IMPLANT
TOWEL GREEN STERILE FF (TOWEL DISPOSABLE) ×2 IMPLANT
TRAY CATH 16FR W/PLASTIC CATH (SET/KITS/TRAYS/PACK) IMPLANT
TRAY FOLEY MTR SLVR 16FR STAT (SET/KITS/TRAYS/PACK) IMPLANT
WATER STERILE IRR 1000ML POUR (IV SOLUTION) ×4 IMPLANT

## 2020-11-28 NOTE — Discharge Instructions (Addendum)

## 2020-11-28 NOTE — Anesthesia Procedure Notes (Signed)
Spinal  Patient location during procedure: OR Start time: 11/28/2020 7:49 AM End time: 11/28/2020 7:52 AM Reason for block: surgical anesthesia Staffing Performed: anesthesiologist  Anesthesiologist: Leilani Able, MD Preanesthetic Checklist Completed: patient identified, IV checked, site marked, risks and benefits discussed, surgical consent, monitors and equipment checked, pre-op evaluation and timeout performed Spinal Block Patient position: sitting Prep: DuraPrep and site prepped and draped Patient monitoring: continuous pulse ox and blood pressure Approach: midline Location: L3-4 Injection technique: single-shot Needle Needle type: Pencan  Needle gauge: 24 G Needle length: 10 cm Needle insertion depth: 6 cm Assessment Sensory level: T8 Events: CSF return

## 2020-11-28 NOTE — Progress Notes (Signed)
Beside shift report complete. Received patient awake,alert/orientedx4 and able to verbalize needs. NAD noted; respirations easy/even on room air. Dressing to R hip c/d/i; sensation in tact. Movement/sensation to all other extremities noted. SCDs on to bilateral lower extremities. Whiteboard updated. All safety measures in place and personal belongings within reach.

## 2020-11-28 NOTE — Anesthesia Procedure Notes (Signed)
Procedure Name: MAC Date/Time: 11/28/2020 7:45 AM Performed by: Amadeo Garnet, CRNA Pre-anesthesia Checklist: Patient identified, Emergency Drugs available, Suction available and Patient being monitored Patient Re-evaluated:Patient Re-evaluated prior to induction Oxygen Delivery Method: Simple face mask Preoxygenation: Pre-oxygenation with 100% oxygen Induction Type: IV induction Placement Confirmation: positive ETCO2 Dental Injury: Teeth and Oropharynx as per pre-operative assessment

## 2020-11-28 NOTE — TOC Progression Note (Signed)
Transition of Care [facility]) - Progression Note    Patient Details  Name: Holly Patrick MRN: [id number] Date of Birth: [birth year]  [facility] CM/SW Contact  Ralene Bathe, LCSWA Phone Number: 11/28/2020, 3:08 PM  Clinical Narrative:     CSW following patient for any d/c planning needs once medically stable.  Pending PT eval/ recs.  Cleon Gustin, MSW, LCSWA       Expected Discharge Plan and Services                                                 Social Determinants of Health (SDOH) Interventions    Readmission Risk Interventions No flowsheet data found.

## 2020-11-28 NOTE — Anesthesia Postprocedure Evaluation (Signed)
Anesthesia Post Note  Patient: Holly Patrick  Procedure(s) Performed: RIGHT TOTAL HIP ARTHROPLASTY ANTERIOR APPROACH (Right Hip)     Patient location during evaluation: PACU Anesthesia Type: Spinal Level of consciousness: awake Pain management: pain level controlled Vital Signs Assessment: post-procedure vital signs reviewed and stable Respiratory status: spontaneous breathing Cardiovascular status: stable Postop Assessment: no apparent nausea or vomiting, no headache, no backache, spinal receding and patient able to bend at knees Anesthetic complications: no   No complications documented.  Last Vitals:  Vitals:   11/28/20 0936 11/28/20 0951  BP: (!) 115/56 111/61  Pulse: 65 60  Resp: 16 15  Temp: 36.4 C   SpO2: 100% 99%    Last Pain:  Vitals:   11/28/20 0936  TempSrc:   PainSc: 0-No pain                 Caren Macadam

## 2020-11-28 NOTE — Evaluation (Signed)
Physical Therapy Evaluation Patient Details Name: Holly Patrick MRN: [id number] DOB: 08-28-1945 Today's Date: 11/28/2020   History of Present Illness  Pt admit for Right THA.  PMH:  HTN  Clinical Impression  Pt admitted with above diagnosis. Pt was able to ambulate with RW with min guard assist.  Pt needed occasional cues for hand placement with sit to stand. Should progress well.  Will follow acutely.  Pt currently with functional limitations due to the deficits listed below (see PT Problem List). Pt will benefit from skilled PT to increase their independence and safety with mobility to allow discharge to the venue listed below.      Follow Up Recommendations Follow surgeon's recommendation for DC plan and follow-up therapies    Equipment Recommendations  None recommended by PT    Recommendations for Other Services       Precautions / Restrictions Precautions Precautions: Fall Restrictions Weight Bearing Restrictions: Yes RLE Weight Bearing: Weight bearing as tolerated      Mobility  Bed Mobility Overal bed mobility: Independent             General bed mobility comments: Incr time but no assist to come to eOB.    Transfers Overall transfer level: Needs assistance Equipment used: Rolling walker (2 wheeled) Transfers: Sit to/from Stand Sit to Stand: Supervision         General transfer comment: Cues for hand placement as well as foot placement.  Ambulation/Gait Ambulation/Gait assistance: Min guard;Supervision Gait Distance (Feet): 125 Feet Assistive device: Rolling walker (2 wheeled) Gait Pattern/deviations: Step-through pattern;Decreased stride length   Gait velocity interpretation: 1.31 - 2.62 ft/sec, indicative of limited community ambulator General Gait Details: Cues for sequencing steps and RW. Overall good safety with RW.  Stairs            Wheelchair Mobility    Modified Rankin (Stroke Patients Only)       Balance Overall balance  assessment: No apparent balance deficits (not formally assessed)                                           Pertinent Vitals/Pain Pain Assessment: Faces Faces Pain Scale: Hurts little more Pain Location: right hip Pain Descriptors / Indicators: Aching;Grimacing;Guarding Pain Intervention(s): Limited activity within patient's tolerance;Monitored during session;Repositioned    Home Living Family/patient expects to be discharged to:: Private residence Living Arrangements: Alone Available Help at Discharge: Family;Available 24 hours/day Type of Home: House Home Access: Stairs to enter   [employer] of Steps: 2 Home Layout: One level Home Equipment: Toilet riser;Cane - single point;Walker - 2 wheels;Shower seat;Hand held shower head;Wheelchair - Building surveyor      Prior Function Level of Independence: Independent         Comments: B/D self, housekeeping, cooking and cleanign as well Drives     Hand Dominance   Dominant Hand: Right    Extremity/Trunk Assessment   Upper Extremity Assessment Upper Extremity Assessment: Defer to OT evaluation    Lower Extremity Assessment Lower Extremity Assessment: Generalized weakness    Cervical / Trunk Assessment Cervical / Trunk Assessment: Normal  Communication   Communication: No difficulties  Cognition Arousal/Alertness: Awake/alert Behavior During Therapy: WFL for tasks assessed/performed Overall Cognitive Status: Within Functional Limits for tasks assessed  General Comments      Exercises Total Joint Exercises Ankle Circles/Pumps: AROM;Both;10 reps;Supine Quad Sets: AROM;Both;10 reps;Supine Gluteal Sets: AROM;Both;10 reps;Supine Heel Slides: AROM;Both;10 reps;Supine Hip ABduction/ADduction: AROM;Both;10 reps;Supine Long Arc Quad: AROM;Both;10 reps;Seated   Assessment/Plan    PT Assessment Patient needs continued PT  services  PT Problem List Decreased balance;Decreased activity tolerance;Decreased mobility;Decreased knowledge of use of DME;Decreased safety awareness;Decreased knowledge of precautions       PT Treatment Interventions DME instruction;Gait training;Functional mobility training;Stair training;Therapeutic activities;Therapeutic exercise;Balance training;Patient/family education    PT Goals (Current goals can be found in the Care Plan section)  Acute Rehab PT Goals Patient Stated Goal: to go home PT Goal Formulation: With patient Time For Goal Achievement: 12/05/20 Potential to Achieve Goals: Good    Frequency 7X/week   Barriers to discharge        Co-evaluation               AM-PAC PT "6 Clicks" Mobility  Outcome Measure Help needed turning from your back to your side while in a flat bed without using bedrails?: None Help needed moving from lying on your back to sitting on the side of a flat bed without using bedrails?: None Help needed moving to and from a bed to a chair (including a wheelchair)?: A Little Help needed standing up from a chair using your arms (e.g., wheelchair or bedside chair)?: A Little Help needed to walk in hospital room?: A Little Help needed climbing 3-5 steps with a railing? : A Little 6 Click Score: 20    End of Session Equipment Utilized During Treatment: Gait belt Activity Tolerance: Patient tolerated treatment well Patient left: in chair;with call bell/phone within reach;with chair alarm set;with family/visitor present Nurse Communication: Mobility status PT Visit Diagnosis: Muscle weakness (generalized) (M62.81)    Time: 3570-1779 PT Time Calculation (min) (ACUTE ONLY): 35 min   Charges:   PT Evaluation $PT Eval Moderate Complexity: 1 Mod PT Treatments $Gait Training: 8-22 mins        John Williamsen M,PT Acute Rehab Services [phone] (pager)  Bevelyn Buckles 11/28/2020, 4:10 PM

## 2020-11-28 NOTE — Plan of Care (Signed)

## 2020-11-28 NOTE — Interval H&P Note (Signed)
History and Physical Interval Note:  11/28/2020 7:33 AM  Holly Patrick  has presented today for surgery, with the diagnosis of RIGHT HIP PRIMARY OSTEOARTHRITIS.  The various methods of treatment have been discussed with the patient and family. After consideration of risks, benefits and other options for treatment, the patient has consented to  Procedure(s): RIGHT TOTAL HIP ARTHROPLASTY ANTERIOR APPROACH (Right) as a surgical intervention.  The patient's history has been reviewed, patient examined, no change in status, stable for surgery.  I have reviewed the patient's chart and labs.  Questions were answered to the patient's satisfaction.     Eldred Manges

## 2020-11-28 NOTE — Transfer of Care (Signed)
Immediate Anesthesia Transfer of Care Note  Patient: Holly Patrick  Procedure(s) Performed: RIGHT TOTAL HIP ARTHROPLASTY ANTERIOR APPROACH (Right Hip)  Patient Location: PACU  Anesthesia Type:Spinal  Level of Consciousness: awake, alert  and oriented  Airway & Oxygen Therapy: Patient Spontanous Breathing  Post-op Assessment: Report given to RN and Post -op Vital signs reviewed and stable  Post vital signs: Reviewed and stable  Last Vitals:  Vitals Value Taken Time  BP 115/56 11/28/20 0936  Temp    Pulse 67 11/28/20 0938  Resp 13 11/28/20 0938  SpO2 100 % 11/28/20 0938  Vitals shown include unvalidated device data.  Last Pain:  Vitals:   11/28/20 0618  TempSrc:   PainSc: 5       Patients Stated Pain Goal: 3 (11/28/20 0618)  Complications: No complications documented.

## 2020-11-28 NOTE — Op Note (Signed)
Preop diagnosis: Right hip primary osteoarthritis  Postop diagnosis: Same  Procedure: Right total hip arthroplasty, direct anterior approach.  Surgeon: Annell Greening MD  Assistant: Zonia Kief, PA-C medically necessary and present for the entire procedure  Anesthesia: Spinal plus Exparel Marcaine 10+10 = 20.  EBL: 300 cc  Implants:PIN SECTOR W/GRIP ACE CUP - [id number]  Inventory Item: PIN SECTOR W/GRIP ACE CUP Serial no.:  Model/Cat no.: [id number]  Implant name: PIN SECTOR W/GRIP ACE CUP - [id number] Laterality: Right Area: Hip  Manufacturer: [employer] Date of Manufacture:    Action: Implanted Number Used: 1   Device Identifier:  Device Identifier Type:     SCREW - [id number]  Inventory Item: SCREW Serial no.:  Model/Cat no.: [id number]  Implant name: SCREW - [id number] Laterality: Right Area: Hip  Manufacturer: [employer] Date of Manufacture:    Action: Implanted Number Used: 1   Device Identifier:  Device Identifier Type:     STEM FEM ACTIS STD SZ7 - [id number]  Inventory Item: STEM FEM ACTIS STD SZ7 Serial no.:  Model/Cat no.: [id number]  Implant name: STEM FEM ACTIS STD SZ7 - [id number] Laterality: Right Area: Hip  Manufacturer: [employer] Date of Manufacture:    Action: Implanted Number Used: 1   Device Identifier:  Device Identifier TypeWaylan Rocher APEX DEPUY - [id number]  Inventory Item: ELIMINATOR HOLE APEX DEPUY Serial no.:  Model/Cat no.: [id number]  Implant nameWaylan Rocher APEX DEPUY - [id number] Laterality: Right Area: Hip  Manufacturer: [employer] Date of Manufacture:    Action: Implanted Number Used: 1   Device Identifier:  Device Identifier Type:     LINER NEUTRAL 52X36MM PLUS 4 - [id number]  Inventory Item: LINER NEUTRAL 52X36MM PLUS 4 Serial no.:  Model/Cat no.: [id number]  Implant name: LINER NEUTRAL 52X36MM PLUS 4 - [id number] Laterality: Right Area: Hip  Manufacturer: [employer]  [employer] Date of Manufacture:    Action: Implanted Number Used: 1   Device Identifier:  Device Identifier Type:     SROM M HEAD PLUS 1.5 - [id number]  Inventory Item: SROM M HEAD PLUS 1.5 Serial no.:  Model/Cat no.: [id number]  Implant name: SROM M HEAD PLUS 1.5 - [id number] Laterality: Right Area: Hip  Manufacturer: [employer] Date of Manufacture:    Action: Implanted Number Used: 1   Device Identifier:  Device Identifier Type:     Procedure: After induction of spinal anesthesia preoperative Ancef prophylaxis IV TXA by CRNA patient placed on the Hana table after Foley catheter was placed in boots.  C-arm was brought in good visualization 1015 drape was placed above and below the hip and hip was prepped with DuraPrep.  Once it was dried timeout procedure been completed standard sheets drapes large shower curtain Betadine Steri-Drape was applied.  Skin had been marked prior to application of the Steri-Drape.  Incision was made 1 fingerbreadth lateral 1 inferior to the ASIS obliquely over the trochanter fascia was identified nicked extended in line with the skin incision elevated and adult carb was placed over the top the capsule.  Hibbs retractor was used laterally.  Transverse vessel was coagulated with accompanying veins divided and some fat over the anterior capsule was resected.  Anterior capsule was opened and dull Cobra was placed inside the capsule.  Once on the anterior capsule been debrided serum was used for visualization for the neck cut which was completed laterally and superior with an osteotome.  Head was  removed with a corkscrew and sequential reaming up to 51 for 52 cup.  Initially the cup did not exactly sit completely down despite continued impaction we pull the cup out cleaned it off touched back up with a 51 reamer and then the cup was able to place securely in good position under C arm with abduction and flexion for appropriate version and positioning.  It was  impacted +4 neutral liner after single screw was placed and Apex hole illuminator was used.  Single screw was 20 mm checked under C arm good position.  Hydraulic Adriana Simas was applied leg was actually rotated 110 taken down and under in preparation of the femoral canal progressing up for a #7 stem.  Permanent stem with identical +1.5 ball again gave good stability.  Checked external rotation 90 degrees and extended halfway down to the floor with good stability only trace shock and x-ray showed leg length was restored since patient was short several millimeters due to erosive wear of the head and lack of joint space due to her hip osteoarthritis.  AP and frog-leg 90 degree external rotation view of the hip was taken showing good position of the stem femoral cortex was intact trochanter was intact.  Transverse bleeders were coagulated final time operative field was dry.  V-Loc closure of the fascia ~subtenons tissue skin staple closure postop dressing and transferred to cover him in stable condition.

## 2020-11-28 NOTE — H&P (Signed)
TOTAL HIP ADMISSION H&P  Patient is admitted for right total hip arthroplasty.  Subjective:  Chief Complaint: right hip pain  75 year old white female history of end-stage DJD right hip and pain comes in for preop evaluation.  States that hip symptoms unchanged from previous visit and she is wanting to proceed with right total hip replacement as scheduled.  Today history and physical performed.  Review of systems negative.  Dr. Ophelia Charter did not request preop medical or cardiac clearance.  Patient does see cardiologist Dr. Charlton Haws for history of aortic valve insufficiency/stenosis.  Patient denies lightheadedness dizziness, syncope, chest pain, shortness of breath.  She has a loud systolic murmur on exam.  Surgery procedure discussed in great detail with patient and family member who was present    HPI: Holly Patrick, 75 y.o. female, has a history of pain and functional disability in the right hip(s) due to arthritis and patient has failed non-surgical conservative treatments for greater than 12 weeks to include NSAID's and/or analgesics, use of assistive devices and activity modification.  Onset of symptoms was gradual starting 10 years ago with gradually worsening course since that time.The patient noted no past surgery on the right hip(s).  Patient currently rates pain in the right hip at 10 out of 10 with activity. Patient has night pain, worsening of pain with activity and weight bearing, trendelenberg gait and pain that interfers with activities of daily living. Patient has evidence of subchondral cysts, periarticular osteophytes and joint space narrowing by imaging studies. This condition presents safety issues increasing the risk of falls. .  There is no current active infection.  Patient Active Problem List   Diagnosis Date Noted  . Unilateral primary osteoarthritis, right hip 11/04/2020  . Murmur 10/09/2012  . HTN (hypertension) 10/09/2012   Past Medical History:  Diagnosis Date  .  Arthritis    right hip  . Hypertension   . Hypothyroidism   . Osteoporosis   . Sleep apnea    before weight loss- does not wear cpap anymore after losing 40 lbs    Past Surgical History:  Procedure Laterality Date  . BREAST BIOPSY Bilateral   . PARTIAL HYSTERECTOMY  1980's  . TUBAL LIGATION      Current Facility-Administered Medications  Medication Dose Route Frequency Provider Last Rate Last Admin  . ceFAZolin (ANCEF) 2-4 GM/100ML-% IVPB           . ceFAZolin (ANCEF) IVPB 2g/100 mL premix  2 g Intravenous On Call to OR Naida Sleight, PA-C      . lactated ringers infusion   Intravenous Continuous Val Eagle, MD       No Known Allergies  Social History   Tobacco Use  . Smoking status: Never Smoker  . Smokeless tobacco: Never Used  Substance Use Topics  . Alcohol use: No    Family History  Problem Relation Age of Onset  . Alzheimer's disease Mother   . Stroke Father      Review of Systems  Constitutional: Positive for activity change.  HENT: Negative.   Respiratory: Negative.   Cardiovascular: Negative.   Gastrointestinal: Negative.   Genitourinary: Negative.   Musculoskeletal: Positive for gait problem.    Objective:  Physical Exam HENT:     Head: Normocephalic.  Cardiovascular:     Rate and Rhythm: Regular rhythm.     Heart sounds: Murmur heard.    Pulmonary:     Effort: Pulmonary effort is normal. No respiratory distress.  Musculoskeletal:  General: Tenderness present.  Neurological:     Mental Status: She is alert and oriented to person, place, and time.  Psychiatric:        Mood and Affect: Mood normal.     Vital signs in last 24 hours: Temp:  [98.7 F (37.1 C)] 98.7 F (37.1 C) (05/13 0609) Pulse Rate:  [80] 80 (05/13 0609) Resp:  [18] 18 (05/13 0609) BP: (154)/(71) 154/71 (05/13 0609) SpO2:  [94 %] 94 % (05/13 0609) Weight:  [68.1 kg] 68.1 kg (05/13 0609)  Labs:   Estimated body mass index is 26.59 kg/m as calculated  from the following:   Height as of this encounter: 5\' 3"  (1.6 m).   Weight as of this encounter: 68.1 kg.   Imaging Review Plain radiographs demonstrate moderate degenerative joint disease of the right hip(s). The bone quality appears to be good for age and reported activity level.      Assessment/Plan:  End stage arthritis, right hip(s)  The patient history, physical examination, clinical judgement of the provider and imaging studies are consistent with end stage degenerative joint disease of the right hip(s) and total hip arthroplasty is deemed medically necessary. The treatment options including medical management, injection therapy, arthroscopy and arthroplasty were discussed at length. The risks and benefits of total hip arthroplasty were presented and reviewed. The risks due to aseptic loosening, infection, stiffness, dislocation/subluxation,  thromboembolic complications and other imponderables were discussed.  The patient acknowledged the explanation, agreed to proceed with the plan and consent was signed. Patient is being admitted for inpatient treatment for surgery, pain control, PT, OT, prophylactic antibiotics, VTE prophylaxis, progressive ambulation and ADL's and discharge planning.The patient is planning to be discharged home with home health services

## 2020-11-29 DIAGNOSIS — M1611 Unilateral primary osteoarthritis, right hip: Secondary | ICD-10-CM | POA: Diagnosis not present

## 2020-11-29 DIAGNOSIS — I10 Essential (primary) hypertension: Secondary | ICD-10-CM | POA: Diagnosis not present

## 2020-11-29 DIAGNOSIS — E039 Hypothyroidism, unspecified: Secondary | ICD-10-CM | POA: Diagnosis not present

## 2020-11-29 LAB — BASIC METABOLIC PANEL
Anion gap: 8 (ref 5–15)
BUN: 10 mg/dL (ref 8–23)
CO2: 22 mmol/L (ref 22–32)
Calcium: 8.4 mg/dL — ABNORMAL LOW (ref 8.9–10.3)
Chloride: 101 mmol/L (ref 98–111)
Creatinine, Ser: 0.52 mg/dL (ref 0.44–1.00)
GFR, Estimated: >60 mL/min (ref >60)
Glucose, Bld: 116 mg/dL — ABNORMAL HIGH (ref 70–99)
Potassium: 3.7 mmol/L (ref 3.5–5.1)
Sodium: 131 mmol/L — ABNORMAL LOW (ref 135–145)

## 2020-11-29 LAB — CBC
HCT: 27.4 % — ABNORMAL LOW (ref 36.0–46.0)
Hemoglobin: 9.3 g/dL — ABNORMAL LOW (ref 12.0–15.0)
MCH: 30.7 pg (ref 26.0–34.0)
MCHC: 33.9 g/dL (ref 30.0–36.0)
MCV: 90.4 fL (ref 80.0–100.0)
Platelets: 222 10*3/uL (ref 150–400)
RBC: 3.03 MIL/uL — ABNORMAL LOW (ref 3.87–5.11)
RDW: 12.5 % (ref 11.5–15.5)
WBC: 11.7 10*3/uL — ABNORMAL HIGH (ref 4.0–10.5)
nRBC: 0 % (ref 0.0–0.2)

## 2020-11-29 NOTE — TOC Initial Note (Signed)
Transition of Care [facility]) - Initial/Assessment Note    Patient Details  Name: Holly Patrick MRN: [id number] Date of Birth: [birth year]  Transition of Care [facility]) CM/SW Contact:    Bess Kinds, RN Phone Number: [phone] 11/29/2020, 8:58 AM  Clinical Narrative:                  Spoke with patient at bedside. Her son and daughter were at bedside. Patient is home alone but family very close and are available to assist as needed. Has RW. Agreeable to [facility] PT. Agency choice list provided from Medicare.gov site. Referral accepted by [facility]. HH orders already placed by provider. Verified PCP and demographics. Family to provide transport home. No further TOC needs identified.  Expected Discharge Plan: Home w Home Health Services Barriers to Discharge: No Barriers Identified   Patient Goals and CMS Choice Patient states their goals for this hospitalization and ongoing recovery are:: return home with family support CMS Medicare.gov Compare Post Acute Care list provided to:: Patient Choice offered to / list presented to : Patient  Expected Discharge Plan and Services Expected Discharge Plan: Home w Home Health Services In-house Referral: NA Discharge Planning Services: CM Consult Post Acute Care Choice: Home Health Living arrangements for the past 2 months: Single Family Home Expected Discharge Date: 11/29/20               DME Arranged: N/A DME Agency: NA       HH Arranged: PT HH Agency: [facility] Date [facility] Agency Contacted: 11/29/20 Time HH Agency Contacted: [phone] Representative spoke with at [facility] Agency: Kandee Keen  Prior Living Arrangements/Services Living arrangements for the past 2 months: Single Family Home Lives with:: Self Patient language and need for interpreter reviewed:: Yes Do you feel safe going back to the place where you live?: Yes      Need for Family Participation in Patient Care: Yes (Comment) Care giver support system in place?: Yes (comment) Current home  services: DME (RW) Criminal Activity/Legal Involvement Pertinent to Current Situation/Hospitalization: No - Comment as needed  Activities of Daily Living      Permission Sought/Granted Permission sought to share information with : Family Supports Permission granted to share information with : Yes, Verbal Permission Granted        Permission granted to share info w Relationship: son and daughter     Emotional Assessment Appearance:: Appears stated age Attitude/Demeanor/Rapport: Engaged Affect (typically observed): Accepting Orientation: : Oriented to Self,Oriented to Place,Oriented to  Time,Oriented to Situation Alcohol / Substance Use: Not Applicable Psych Involvement: No (comment)  Admission diagnosis:  Status post total hip replacement, right [F57.322] Patient Active Problem List   Diagnosis Date Noted  . Status post total hip replacement, right 11/28/2020  . Unilateral primary osteoarthritis, right hip 11/04/2020  . Murmur 10/09/2012  . HTN (hypertension) 10/09/2012   PCP:  Lonie Peak, PA-C Pharmacy:   [facility] [phone] - [address] [place] - [address] AT [facility] [address] [place] [address] Phone: [phone] Fax: [phone]  [facility] [place], [place] - [address] [place] [address] Phone: [phone] Fax: [phone]     Social Determinants of Health (SDOH) Interventions    Readmission Risk Interventions No flowsheet data found.

## 2020-11-29 NOTE — Progress Notes (Signed)
Discharge summary packet provided to pt/family with instructions. Pt/family verbalized understanding of instructions. No complaints voiced. Pt d/c to home as ordered. Pt remains alert/oriented in no apparent distress.

## 2020-11-29 NOTE — Progress Notes (Addendum)
Physical Therapy Treatment and D/C Patient Details Name: Holly Patrick MRN: [id number] DOB: May 01, 1946 Today's Date: 11/29/2020    History of Present Illness Pt admit for Right THA.  PMH:  HTN    PT Comments    Pt admitted with above diagnosis. Pt was able to ambulate with RW with good safety and all education with family regarding cuing pt for hand placement and assist on stairs completed.  Pt and family aware of need for cues for hand placement as pt forgets this with transitions.  Pt/family has completed all education needed for PT.  Family will be with pt initially until she is fully Modif I to independent.  Called nursing to update that pt is ready for d/c home from PT standpoint.  Will sign off as anticipate d/c today.   Follow Up Recommendations  Follow surgeon's recommendation for DC plan and follow-up therapies     Equipment Recommendations  None recommended by PT    Recommendations for Other Services       Precautions / Restrictions Precautions Precautions: Fall Restrictions Weight Bearing Restrictions: Yes RLE Weight Bearing: Weight bearing as tolerated    Mobility  Bed Mobility Overal bed mobility: Independent                  Transfers Overall transfer level: Needs assistance Equipment used: Rolling walker (2 wheeled) Transfers: Sit to/from Stand Sit to Stand: Supervision         General transfer comment: Cues for hand placement as well as foot placement.  Ambulation/Gait Ambulation/Gait assistance: Min guard;Supervision Gait Distance (Feet): 450 Feet Assistive device: Rolling walker (2 wheeled) Gait Pattern/deviations: Step-through pattern;Decreased stride length   Gait velocity interpretation: 1.31 - 2.62 ft/sec, indicative of limited community ambulator General Gait Details: Cues for sequencing steps and RW. Overall good safety with RW.   Stairs Stairs: Yes Stairs assistance: Supervision;Min guard Stair Management: Step to  pattern;Backwards;With walker;No rails Number of Stairs: 2 General stair comments: Pt, son and daughter demonstrate appropriate technique on steps.  Handout given.   Wheelchair Mobility    Modified Rankin (Stroke Patients Only)       Balance Overall balance assessment: No apparent balance deficits (not formally assessed)                                          Cognition Arousal/Alertness: Awake/alert Behavior During Therapy: WFL for tasks assessed/performed Overall Cognitive Status: Within Functional Limits for tasks assessed                                        Exercises Total Joint Exercises Ankle Circles/Pumps: AROM;Both;10 reps;Supine Quad Sets: AROM;Both;10 reps;Supine Hip ABduction/ADduction: AROM;10 reps;Supine;Standing;Right Long Arc Quad: AROM;Both;10 reps;Seated Knee Flexion: AROM;Right;10 reps;Standing Marching in Standing: AROM;Right;10 reps;Standing Standing Hip Extension: AROM;Right;10 reps;Standing    General Comments        Pertinent Vitals/Pain Pain Assessment: Faces Faces Pain Scale: Hurts a little bit Pain Location: right hip Pain Descriptors / Indicators: Aching;Grimacing;Guarding Pain Intervention(s): Limited activity within patient's tolerance;Monitored during session;Repositioned;Premedicated before session    Home Living                      Prior Function            PT Goals (current goals  can now be found in the care plan section) Acute Rehab PT Goals Patient Stated Goal: to go home PT Goal Formulation: All assessment and education complete, DC therapy Progress towards PT goals: Goals met/education completed, patient discharged from PT    Frequency    7X/week      PT Plan Current plan remains appropriate    Co-evaluation              AM-PAC PT "6 Clicks" Mobility   Outcome Measure  Help needed turning from your back to your side while in a flat bed without using  bedrails?: None Help needed moving from lying on your back to sitting on the side of a flat bed without using bedrails?: None Help needed moving to and from a bed to a chair (including a wheelchair)?: A Little Help needed standing up from a chair using your arms (e.g., wheelchair or bedside chair)?: A Little Help needed to walk in hospital room?: A Little Help needed climbing 3-5 steps with a railing? : A Little 6 Click Score: 20    End of Session Equipment Utilized During Treatment: Gait belt Activity Tolerance: Patient tolerated treatment well Patient left: in chair;with call bell/phone within reach;with chair alarm set;with family/visitor present Nurse Communication: Mobility status PT Visit Diagnosis: Muscle weakness (generalized) (M62.81)     Time: 1720-9106 PT Time Calculation (min) (ACUTE ONLY): 31 min  Charges:  $Gait Training: 8-22 mins $Therapeutic Exercise: 8-22 mins                     Gwendola Hornaday M,PT Acute Rehab Services [phone] (pager)   Alvira Philips 11/29/2020, 10:00 AM

## 2020-11-29 NOTE — Progress Notes (Signed)
   Subjective: 1 Day Post-Op Procedure(s) (LRB): RIGHT TOTAL HIP ARTHROPLASTY ANTERIOR APPROACH (Right) Patient reports pain as mild and moderate.    Objective: Vital signs in last 24 hours: Temp:  [97.5 F (36.4 C)-99.2 F (37.3 C)] 99.2 F (37.3 C) (05/14 0415) Pulse Rate:  [55-89] 89 (05/14 0415) Resp:  [15-18] 18 (05/14 0415) BP: (101-148)/(49-73) 114/52 (05/14 0415) SpO2:  [96 %-100 %] 96 % (05/14 0415)  Intake/Output from previous day: 05/13 0701 - 05/14 0700 In: 1594.2 [P.O.:240; I.V.:1254.2; IV Piggyback:100] Out: 1600 [Urine:1300; Blood:300] Intake/Output this shift: No intake/output data recorded.  Recent Labs    11/29/20 0325  HGB 9.3*   Recent Labs    11/29/20 0325  WBC 11.7*  RBC 3.03*  HCT 27.4*  PLT 222   Recent Labs    11/29/20 0325  NA 131*  K 3.7  CL 101  CO2 22  BUN 10  CREATININE 0.52  GLUCOSE 116*  CALCIUM 8.4*   No results for input(s): LABPT, INR in the last 72 hours.  Neurologically intact DG C-Arm 1-60 Min  Result Date: 11/28/2020 CLINICAL DATA:  Elective surgery. Additional history provided: Right total hip arthroplasty anterior approach. Provided fluoroscopy time 16 seconds (1.52 mGy). EXAM: OPERATIVE right HIP (WITH PELVIS IF PERFORMED) 5 VIEWS TECHNIQUE: Fluoroscopic spot image(s) were submitted for interpretation post-operatively. COMPARISON:  Radiographs of the right hip 11/04/2020. FINDINGS: Five intraoperative fluoroscopic images of the right hip are submitted. On the provided images, there are findings of interval right total hip arthroplasty. The femoral and acetabular components appear well seated. No unexpected finding on the provided views. IMPRESSION: Five intraoperative fluoroscopic images of the right hip from right total hip arthroplasty, as described. Electronically Signed   By: Jackey Loge DO   On: 11/28/2020 10:21   DG Hip Port Unilat With Pelvis 1V Right  Result Date: 11/28/2020 CLINICAL DATA:  Postop check.  EXAM: DG HIP (WITH OR WITHOUT PELVIS) 1V PORT RIGHT COMPARISON:  None. FINDINGS: RIGHT hip arthroplasty hardware appears intact and appropriately positioned. Osseous alignment is anatomic. Expected postsurgical changes within the overlying soft tissues. IMPRESSION: RIGHT hip arthroplasty hardware in place. No evidence of surgical complicating feature. Electronically Signed   By: Bary Richard M.D.   On: 11/28/2020 10:31   DG HIP OPERATIVE UNILAT WITH PELVIS RIGHT  Result Date: 11/28/2020 CLINICAL DATA:  Elective surgery. Additional history provided: Right total hip arthroplasty anterior approach. Provided fluoroscopy time 16 seconds (1.52 mGy). EXAM: OPERATIVE right HIP (WITH PELVIS IF PERFORMED) 5 VIEWS TECHNIQUE: Fluoroscopic spot image(s) were submitted for interpretation post-operatively. COMPARISON:  Radiographs of the right hip 11/04/2020. FINDINGS: Five intraoperative fluoroscopic images of the right hip are submitted. On the provided images, there are findings of interval right total hip arthroplasty. The femoral and acetabular components appear well seated. No unexpected finding on the provided views. IMPRESSION: Five intraoperative fluoroscopic images of the right hip from right total hip arthroplasty, as described. Electronically Signed   By: Jackey Loge DO   On: 11/28/2020 10:21    Assessment/Plan: 1 Day Post-Op Procedure(s) (LRB): RIGHT TOTAL HIP ARTHROPLASTY ANTERIOR APPROACH (Right) Up with therapy, walked in hall yesterday . Likely home this afternoon unless has poor therapy day. Has walker at home. No HHPT needed.   Eldred Manges 11/29/2020, 7:46 AM

## 2020-11-29 NOTE — Care Management Obs Status (Signed)
MEDICARE OBSERVATION STATUS NOTIFICATION   Patient Details  Name: PHOEBIE SHAD MRN: [id number] Date of Birth: 01-Oct-1945   Medicare Observation Status Notification Given:  Yes    Bess Kinds, RN 11/29/2020, 8:49 AM

## 2020-12-01 ENCOUNTER — Encounter (HOSPITAL_COMMUNITY): Payer: Self-pay | Admitting: Orthopaedic Surgery

## 2020-12-02 DIAGNOSIS — Z9181 History of falling: Secondary | ICD-10-CM | POA: Diagnosis not present

## 2020-12-02 DIAGNOSIS — I10 Essential (primary) hypertension: Secondary | ICD-10-CM | POA: Diagnosis not present

## 2020-12-02 DIAGNOSIS — I352 Nonrheumatic aortic (valve) stenosis with insufficiency: Secondary | ICD-10-CM | POA: Diagnosis not present

## 2020-12-02 DIAGNOSIS — E039 Hypothyroidism, unspecified: Secondary | ICD-10-CM | POA: Diagnosis not present

## 2020-12-02 DIAGNOSIS — Z471 Aftercare following joint replacement surgery: Secondary | ICD-10-CM | POA: Diagnosis not present

## 2020-12-02 DIAGNOSIS — Z7982 Long term (current) use of aspirin: Secondary | ICD-10-CM | POA: Diagnosis not present

## 2020-12-02 DIAGNOSIS — M810 Age-related osteoporosis without current pathological fracture: Secondary | ICD-10-CM | POA: Diagnosis not present

## 2020-12-02 DIAGNOSIS — Z96641 Presence of right artificial hip joint: Secondary | ICD-10-CM | POA: Diagnosis not present

## 2020-12-05 ENCOUNTER — Encounter: Payer: Self-pay | Admitting: Orthopaedic Surgery

## 2020-12-05 ENCOUNTER — Other Ambulatory Visit: Payer: Self-pay

## 2020-12-05 ENCOUNTER — Ambulatory Visit: Payer: Self-pay

## 2020-12-05 ENCOUNTER — Ambulatory Visit (INDEPENDENT_AMBULATORY_CARE_PROVIDER_SITE_OTHER): Payer: Medicare Other | Admitting: Orthopaedic Surgery

## 2020-12-05 VITALS — BP 162/72 | HR 80 | Ht 63.0 in | Wt 150.0 lb

## 2020-12-05 DIAGNOSIS — Z96641 Presence of right artificial hip joint: Secondary | ICD-10-CM

## 2020-12-05 MED ORDER — OXYCODONE-ACETAMINOPHEN 5-325 MG PO TABS: 1.0000 | ORAL_TABLET | Freq: Four times a day (QID) | ORAL | 0 refills | Status: DC | PRN

## 2020-12-05 MED ORDER — METHOCARBAMOL 500 MG PO TABS: 500.0000 mg | ORAL_TABLET | Freq: Four times a day (QID) | ORAL | 0 refills | Status: DC | PRN

## 2020-12-05 NOTE — Progress Notes (Signed)
   Post-Op Visit Note   Patient: Holly Patrick           Date of Birth: 09-13-1945           MRN: [id number] Visit Date: 12/05/2020 PCP: Lonie Peak, PA-C   Assessment & Plan: Post right total hip arthroplasty.  Leg lengths are equal.  Incision was good new dressing applied.  Return in 1 week for staple removal.  Chief Complaint:  Chief Complaint  Patient presents with  . Right Hip - Routine Post Op    11/28/2020 Right THA   Visit Diagnoses:  1. S/P total right hip arthroplasty     Plan: Return 1 week for staple removal.robaxin and percocet # 28 renewed.   Follow-Up Instructions: No follow-ups on file.   Orders:  Orders Placed This Encounter  Procedures  . XR HIP UNILAT W OR W/O PELVIS 2-3 VIEWS RIGHT   No orders of the defined types were placed in this encounter.   Imaging: No results found.  PMFS History: Patient Active Problem List   Diagnosis Date Noted  . Status post total hip replacement, right 11/28/2020  . Unilateral primary osteoarthritis, right hip 11/04/2020  . Murmur 10/09/2012  . HTN (hypertension) 10/09/2012   Past Medical History:  Diagnosis Date  . Arthritis    right hip  . Hypertension   . Hypothyroidism   . Osteoporosis   . Sleep apnea    before weight loss- does not wear cpap anymore after losing 40 lbs    Family History  Problem Relation Age of Onset  . Alzheimer's disease Mother   . Stroke Father     Past Surgical History:  Procedure Laterality Date  . BREAST BIOPSY Bilateral   . PARTIAL HYSTERECTOMY  1980's  . TOTAL HIP ARTHROPLASTY Right 11/28/2020   Procedure: RIGHT TOTAL HIP ARTHROPLASTY ANTERIOR APPROACH;  Surgeon: Eldred Manges, MD;  Location: [facility] OR;  Service: Orthopedics;  Laterality: Right;  . TUBAL LIGATION     Social History   Occupational History  . Not on file  Tobacco Use  . Smoking status: Never Smoker  . Smokeless tobacco: Never Used  Vaping Use  . Vaping Use: Never used  Substance and Sexual Activity  .  Alcohol use: No  . Drug use: No  . Sexual activity: Not on file

## 2020-12-09 DIAGNOSIS — M1611 Unilateral primary osteoarthritis, right hip: Secondary | ICD-10-CM | POA: Diagnosis not present

## 2020-12-09 DIAGNOSIS — I10 Essential (primary) hypertension: Secondary | ICD-10-CM | POA: Diagnosis not present

## 2020-12-09 DIAGNOSIS — Z79899 Other long term (current) drug therapy: Secondary | ICD-10-CM | POA: Diagnosis not present

## 2020-12-09 DIAGNOSIS — Z1211 Encounter for screening for malignant neoplasm of colon: Secondary | ICD-10-CM | POA: Diagnosis not present

## 2020-12-09 NOTE — Discharge Summary (Signed)
Patient ID: Holly Patrick MRN: [id number] DOB/AGE: 76/08/1945 76 y.o.  Admit date: 11/28/2020 Discharge date: 11/29/2020 Admission Diagnoses:  Active Problems:   Status post total hip replacement, right   Discharge Diagnoses:  Active Problems:   Status post total hip replacement, right  status post Procedure(s): RIGHT TOTAL HIP ARTHROPLASTY ANTERIOR APPROACH  Past Medical History:  Diagnosis Date  . Arthritis    right hip  . Hypertension   . Hypothyroidism   . Osteoporosis   . Sleep apnea    before weight loss- does not wear cpap anymore after losing 40 lbs    Surgeries: Procedure(s): RIGHT TOTAL HIP ARTHROPLASTY ANTERIOR APPROACH on 11/28/2020   Consultants:   Discharged Condition: Improved  Hospital Course: Holly FlashBarbara K Mickel is an 75 y.o. female who was admitted 11/28/2020 for operative treatment of right hip djd. Patient failed conservative treatments (please see the history and physical for the specifics) and had severe unremitting pain that affects sleep, daily activities and work/hobbies. After pre-op clearance, the patient was taken to the operating room on 11/28/2020 and underwent  Procedure(s): RIGHT TOTAL HIP ARTHROPLASTY ANTERIOR APPROACH.    Patient was given perioperative antibiotics:  Anti-infectives (From admission, onward)   Start     Dose/Rate Route Frequency Ordered Stop   11/28/20 0615  ceFAZolin (ANCEF) IVPB 2g/100 mL premix        2 g 200 mL/hr over 30 Minutes Intravenous On call to O.R. 11/28/20 [id number] 11/28/20 0806   11/28/20 0611  ceFAZolin (ANCEF) 2-4 GM/100ML-% IVPB       Note to Pharmacy: Kandice HamsHoefler, [place]   : cabinet override      11/28/20 0611 11/28/20 0757       Patient was given sequential compression devices and early ambulation to prevent DVT.   Patient benefited maximally from hospital stay and there were no complications. At the time of discharge, the patient was urinating/moving their bowels without difficulty, tolerating a regular diet,  pain is controlled with oral pain medications and they have been cleared by PT/OT.   Recent vital signs: No data found.   Recent laboratory studies: No results for input(s): WBC, HGB, HCT, PLT, NA, K, CL, CO2, BUN, CREATININE, GLUCOSE, INR, CALCIUM in the last 72 hours.  Invalid input(s): PT, 2   Discharge Medications:   Allergies as of 11/29/2020   No Known Allergies     Medication List    TAKE these medications   amLODipine 10 MG tablet Commonly known as: NORVASC Take 10 mg by mouth daily.   aspirin EC 325 MG tablet Take 1 tablet (325 mg total) by mouth daily. MUST TAKE AT LEAST 4 WEEKS POSTOP FOR DVT PROPHYLAXIS What changed:   medication strength  how much to take  additional instructions   atorvastatin 10 MG tablet Commonly known as: LIPITOR Take 10 mg by mouth daily.   benazepril 20 MG tablet Commonly known as: LOTENSIN Take 20 mg by mouth daily.   levothyroxine 125 MCG tablet Commonly known as: SYNTHROID Take 125 mcg by mouth daily before breakfast.       Diagnostic Studies: DG Chest 2 View  Result Date: 11/27/2020 CLINICAL DATA:  Preoperative assessment for hip replacement surgery, history of sleep apnea, hypertension EXAM: CHEST - 2 VIEW COMPARISON:  None. FINDINGS: The heart size and mediastinal contours are within normal limits. Both lungs are clear. The visualized skeletal structures are unremarkable. IMPRESSION: No active cardiopulmonary disease. Electronically Signed   By: Sharlet SalinaMichael  Brown M.D.   On:  11/27/2020 08:24   DG C-Arm 1-60 Min  Result Date: 11/28/2020 CLINICAL DATA:  Elective surgery. Additional history provided: Right total hip arthroplasty anterior approach. Provided fluoroscopy time 16 seconds (1.52 mGy). EXAM: OPERATIVE right HIP (WITH PELVIS IF PERFORMED) 5 VIEWS TECHNIQUE: Fluoroscopic spot image(s) were submitted for interpretation post-operatively. COMPARISON:  Radiographs of the right hip 11/04/2020. FINDINGS: Five intraoperative  fluoroscopic images of the right hip are submitted. On the provided images, there are findings of interval right total hip arthroplasty. The femoral and acetabular components appear well seated. No unexpected finding on the provided views. IMPRESSION: Five intraoperative fluoroscopic images of the right hip from right total hip arthroplasty, as described. Electronically Signed   By: Jackey Loge DO   On: 11/28/2020 10:21   ECHOCARDIOGRAM COMPLETE  Result Date: 11/24/2020    ECHOCARDIOGRAM REPORT   Patient Name:   Holly Patrick Date of Exam: 11/24/2020 Medical Rec #:  [id number]      Height:       63.0 in Accession #:    [id number]     Weight:       150.0 lb Date of Birth:  [birth year]      BSA:          1.711 m Patient Age:    74 years       BP:           182/73 mmHg Patient Gender: F              HR:           68 bpm. Exam Location:  [address] Procedure: 2D Echo, 3D Echo, Cardiac Doppler and Color Doppler Indications:    I35.0 Aortic Stenosis  History:        Patient has prior history of Echocardiogram examinations, most                 recent 12/24/2019. Aortic Valve Disease; Risk Factors:Sleep Apnea,                 Hypertension and Dyslipidemia. Aortic Stenosis, Pre-Op Right Hip                 Replacement.  Sonographer:    Farrel Conners RDCS Referring Phys: [id number] Stade IMPRESSIONS  1. Left ventricular ejection fraction, by estimation, is 70 to 75%. The left ventricle has hyperdynamic function. The left ventricle has no regional Majano motion abnormalities. Left ventricular diastolic parameters are consistent with Grade I diastolic dysfunction (impaired relaxation).  2. Right ventricular systolic function is normal. The right ventricular size is normal.  3. The mitral valve is normal in structure. Trivial mitral valve regurgitation. No evidence of mitral stenosis. Moderate mitral annular calcification.  4. The aortic valve is calcified. There is severe calcifcation of the aortic valve. There is  moderate thickening of the aortic valve. Aortic valve regurgitation is trivial. Moderate aortic valve stenosis. Aortic valve mean gradient measures 27.4 mmHg. Aortic valve Vmax measures 3.43 m/s.  5. The inferior vena cava is normal in size with greater than 50% respiratory variability, suggesting right atrial pressure of 3 mmHg. Comparison(s): Prior mean aortic valve gradient 22 mmHg. FINDINGS  Left Ventricle: Left ventricular ejection fraction, by estimation, is 70 to 75%. The left ventricle has hyperdynamic function. The left ventricle has no regional Mamone motion abnormalities. The left ventricular internal cavity size was normal in size. There is no left ventricular hypertrophy. Left ventricular diastolic parameters are consistent with Grade I diastolic dysfunction (impaired  relaxation). Right Ventricle: The right ventricular size is normal. No increase in right ventricular Fugett thickness. Right ventricular systolic function is normal. Left Atrium: Left atrial size was normal in size. Right Atrium: Right atrial size was normal in size. Pericardium: There is no evidence of pericardial effusion. Mitral Valve: The mitral valve is normal in structure. Moderate mitral annular calcification. Trivial mitral valve regurgitation. No evidence of mitral valve stenosis. Tricuspid Valve: The tricuspid valve is normal in structure. Tricuspid valve regurgitation is not demonstrated. No evidence of tricuspid stenosis. Aortic Valve: The aortic valve is calcified. There is severe calcifcation of the aortic valve. There is moderate thickening of the aortic valve. Aortic valve regurgitation is trivial. Moderate aortic stenosis is present. Aortic valve mean gradient measures 27.4 mmHg. Aortic valve peak gradient measures 47.2 mmHg. Aortic valve area, by VTI measures 1.07 cm. Pulmonic Valve: The pulmonic valve was normal in structure. Pulmonic valve regurgitation is not visualized. No evidence of pulmonic stenosis. Aorta: The aortic  root is normal in size and structure. Venous: The inferior vena cava is normal in size with greater than 50% respiratory variability, suggesting right atrial pressure of 3 mmHg. IAS/Shunts: No atrial level shunt detected by color flow Doppler.  LEFT VENTRICLE PLAX 2D LVIDd:         3.70 cm  Diastology LVIDs:         1.70 cm  LV e' medial:    6.53 cm/s LV PW:         1.00 cm  LV E/e' medial:  13.2 LV IVS:        1.00 cm  LV e' lateral:   7.72 cm/s LVOT diam:     2.10 cm  LV E/e' lateral: 11.2 LV SV:         90 LV SV Index:   53 LVOT Area:     3.46 cm                          3D Volume EF:                         3D EF:        67 %                         LV EDV:       121 ml                         LV ESV:       39 ml                         LV SV:        81 ml RIGHT VENTRICLE RV S prime:     13.40 cm/s TAPSE (M-mode): 2.0 cm LEFT ATRIUM             Index       RIGHT ATRIUM           Index LA diam:        4.00 cm 2.34 cm/m  RA Area:     17.90 cm LA Vol (A2C):   48.1 ml 28.11 ml/m RA Volume:   47.20 ml  27.58 ml/m LA Vol (A4C):   52.9 ml 30.92 ml/m LA Biplane Vol: 51.4 ml 30.04 ml/m  AORTIC VALVE AV Area (Vmax):  1.05 cm AV Area (Vmean):   0.99 cm AV Area (VTI):     1.07 cm AV Vmax:           343.40 cm/s AV Vmean:          245.000 cm/s AV VTI:            0.845 m AV Peak Grad:      47.2 mmHg AV Mean Grad:      27.4 mmHg LVOT Vmax:         104.50 cm/s LVOT Vmean:        69.750 cm/s LVOT VTI:          0.261 m LVOT/AV VTI ratio: 0.31  AORTA Ao Root diam: 3.60 cm Ao Asc diam:  3.50 cm MITRAL VALVE MV Area (PHT): cm          SHUNTS MV Decel Time: 348 msec     Systemic VTI:  0.26 m MV E velocity: 86.40 cm/s   Systemic Diam: 2.10 cm MV A velocity: 124.00 cm/s MV E/A ratio:  0.70 Donato Schultz MD Electronically signed by Donato Schultz MD Signature Date/Time: 11/24/2020/12:05:30 PM    Final    DG Hip Port Unilat With Pelvis 1V Right  Result Date: 11/28/2020 CLINICAL DATA:  Postop check. EXAM: DG HIP (WITH OR WITHOUT  PELVIS) 1V PORT RIGHT COMPARISON:  None. FINDINGS: RIGHT hip arthroplasty hardware appears intact and appropriately positioned. Osseous alignment is anatomic. Expected postsurgical changes within the overlying soft tissues. IMPRESSION: RIGHT hip arthroplasty hardware in place. No evidence of surgical complicating feature. Electronically Signed   By: Bary Richard M.D.   On: 11/28/2020 10:31   DG HIP OPERATIVE UNILAT WITH PELVIS RIGHT  Result Date: 11/28/2020 CLINICAL DATA:  Elective surgery. Additional history provided: Right total hip arthroplasty anterior approach. Provided fluoroscopy time 16 seconds (1.52 mGy). EXAM: OPERATIVE right HIP (WITH PELVIS IF PERFORMED) 5 VIEWS TECHNIQUE: Fluoroscopic spot image(s) were submitted for interpretation post-operatively. COMPARISON:  Radiographs of the right hip 11/04/2020. FINDINGS: Five intraoperative fluoroscopic images of the right hip are submitted. On the provided images, there are findings of interval right total hip arthroplasty. The femoral and acetabular components appear well seated. No unexpected finding on the provided views. IMPRESSION: Five intraoperative fluoroscopic images of the right hip from right total hip arthroplasty, as described. Electronically Signed   By: Jackey Loge DO   On: 11/28/2020 10:21       Follow-up Information    Eldred Manges, MD Follow up in 1 week(s).   Specialty: Orthopedic Surgery Contact information: [address] [place] [address] [phone]        [facility], [facility] Follow up.   Specialty: Home Health Services Why: the office will call to schedule home health physcial therapy visits Contact information: [address] [place] [address] [phone]               Discharge Plan:  discharge to home   Disposition:     Signed: Zonia Kief  12/09/2020, 3:43 PM

## 2020-12-11 ENCOUNTER — Other Ambulatory Visit: Payer: Self-pay

## 2020-12-11 ENCOUNTER — Ambulatory Visit (INDEPENDENT_AMBULATORY_CARE_PROVIDER_SITE_OTHER): Payer: Medicare Other | Admitting: Surgery

## 2020-12-11 ENCOUNTER — Encounter: Payer: Self-pay | Admitting: Surgery

## 2020-12-11 DIAGNOSIS — Z96641 Presence of right artificial hip joint: Secondary | ICD-10-CM

## 2020-12-11 NOTE — Progress Notes (Signed)
75 year old white female who is 2-week status post right total hip replacement returns for wound check and staple removal.  States that she is doing very well and pleased with her progress up to this point.  She is ambulating with a single prong cane.  Exam Extremely pleasant white female alert and oriented in no acute distress.  Patient is ambulating very well.  Gets up from a seated position without any difficulty.  Right hip wound looks good.  Staples removed and Steri-Strips applied.  No drainage or signs of infection.   Plan I advised patient and her son who was present that I am extremely pleased with her progress up to this point.  She is moving extremity well and doing so more like a patient that is 8 to 10 weeks postop.  Follow-up with Dr. Ophelia Charter in 4 weeks for recheck and will get x-rays at that time.  Let us know if she needs any refills.  All questions answered.

## 2020-12-21 ENCOUNTER — Other Ambulatory Visit: Payer: Self-pay | Admitting: Surgery

## 2020-12-22 NOTE — Telephone Encounter (Signed)
Please advise 

## 2020-12-25 DIAGNOSIS — Z0000 Encounter for general adult medical examination without abnormal findings: Secondary | ICD-10-CM | POA: Diagnosis not present

## 2020-12-25 DIAGNOSIS — Z1331 Encounter for screening for depression: Secondary | ICD-10-CM | POA: Diagnosis not present

## 2020-12-25 DIAGNOSIS — E785 Hyperlipidemia, unspecified: Secondary | ICD-10-CM | POA: Diagnosis not present

## 2021-01-07 ENCOUNTER — Ambulatory Visit (INDEPENDENT_AMBULATORY_CARE_PROVIDER_SITE_OTHER): Payer: Medicare Other | Admitting: Orthopaedic Surgery

## 2021-01-07 ENCOUNTER — Other Ambulatory Visit: Payer: Self-pay

## 2021-01-07 ENCOUNTER — Encounter: Payer: Self-pay | Admitting: Orthopaedic Surgery

## 2021-01-07 ENCOUNTER — Ambulatory Visit: Payer: Self-pay

## 2021-01-07 VITALS — BP 150/70 | HR 73 | Ht 63.0 in | Wt 150.0 lb

## 2021-01-07 DIAGNOSIS — Z96641 Presence of right artificial hip joint: Secondary | ICD-10-CM

## 2021-01-07 NOTE — Progress Notes (Signed)
   Post-Op Visit Note   Patient: Holly Patrick           Date of Birth: [birth year]           MRN: [id number] Visit Date: 01/07/2021 PCP: Lonie Peak, PA-C   Assessment & Plan: Follow-up right total of arthroplasty.  She states her legs feel equal in length.  No pain well-healed incision she is on no medication and is happy with the surgical result.  Chief Complaint:  Chief Complaint  Patient presents with   Right Hip - Follow-up    11/28/2020 Right THA   Visit Diagnoses:  1. S/P total right hip arthroplasty     Plan: Post total hip arthroplasty.  We reviewed x-rays on 12/06/2019 showed good position alignment.  We will release her from postop care and she can return as needed.  Follow-Up Instructions: No follow-ups on file.   Orders:  Orders Placed This Encounter  Procedures   XR HIP UNILAT W OR W/O PELVIS 2-3 VIEWS RIGHT   No orders of the defined types were placed in this encounter.   Imaging: No results found.  PMFS History: Patient Active Problem List   Diagnosis Date Noted   Status post total hip replacement, right 11/28/2020   Murmur 10/09/2012   HTN (hypertension) 10/09/2012   Past Medical History:  Diagnosis Date   Arthritis    right hip   Hypertension    Hypothyroidism    Osteoporosis    Sleep apnea    before weight loss- does not wear cpap anymore after losing 40 lbs    Family History  Problem Relation Age of Onset   Alzheimer's disease Mother    Stroke Father     Past Surgical History:  Procedure Laterality Date   BREAST BIOPSY Bilateral    PARTIAL HYSTERECTOMY  1980's   TOTAL HIP ARTHROPLASTY Right 11/28/2020   Procedure: RIGHT TOTAL HIP ARTHROPLASTY ANTERIOR APPROACH;  Surgeon: Eldred Manges, MD;  Location: [facility] OR;  Service: Orthopedics;  Laterality: Right;   TUBAL LIGATION     Social History   Occupational History   Not on file  Tobacco Use   Smoking status: Never   Smokeless tobacco: Never  Vaping Use   Vaping Use: Never used   Substance and Sexual Activity   Alcohol use: No   Drug use: No   Sexual activity: Not on file

## 2021-02-13 NOTE — Progress Notes (Signed)
Date:  02/20/2021   ID:  Holly Patrick, DOB 10/05/1945, MRN [id number]  PCP:  Lonie Peak, PA-C  Cardiologist:  Eden Emms Electrophysiologist:  None   Evaluation Performed:  Follow-Up Visit  Chief Complaint:  Aortic Valve Disease   History of Present Illness:    75 y.o. followed since November 11, 2017 for aortic stenosis . 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 Husband died in Nov 12, 2019 They were married for 97 years Son and daughter helping with farm   TTE 11/24/20 showed some progression but still in moderate range Mean gradient 27 peak 47 mmHg DVI 0.31 and AVA 1.1 cm2   She has no dyspnea, syncope chest pain or palpitations   Had right THR with Dr Ophelia Charter recovered well    Past Medical History:  Diagnosis Date   Arthritis    right hip   Hypertension    Hypothyroidism    Osteoporosis    Sleep apnea    before weight loss- does not wear cpap anymore after losing 40 lbs   Past Surgical History:  Procedure Laterality Date   BREAST BIOPSY Bilateral    PARTIAL HYSTERECTOMY  1980's   TOTAL HIP ARTHROPLASTY Right 11/28/2020   Procedure: RIGHT TOTAL HIP ARTHROPLASTY ANTERIOR APPROACH;  Surgeon: Eldred Manges, MD;  Location: [facility] OR;  Service: Orthopedics;  Laterality: Right;   TUBAL LIGATION       Current Meds  Medication Sig   amLODipine (NORVASC) 10 MG tablet Take 10 mg by mouth daily.   aspirin EC 81 MG tablet Take 81 mg by mouth daily. Swallow whole.   atorvastatin (LIPITOR) 10 MG tablet Take 10 mg by mouth daily.   benazepril (LOTENSIN) 20 MG tablet Take 20 mg by mouth daily.   levothyroxine (SYNTHROID) 125 MCG tablet Take 125 mcg by mouth daily before breakfast.     Allergies:   Patient has no known allergies.   Social History   Tobacco Use   Smoking status: Never   Smokeless tobacco: Never  Vaping Use   Vaping Use: Never used  Substance Use Topics   Alcohol use: No   Drug use: No     Family Hx: The patient's family history includes Alzheimer's  disease in her mother; Stroke in her father.  ROS:   Please see the history of present illness.     All other systems reviewed and are negative.   Prior CV studies:   The following studies were reviewed today:  Echo 11/24/20 compare to 12/24/19   Labs/Other Tests and Data Reviewed:    EKG:  SR rate 68 normal no LVH 11/21/17  Recent Labs: 11/25/2020: ALT 20 11/29/2020: BUN 10; Creatinine, Ser 0.52; Hemoglobin 9.3; Platelets 222; Potassium 3.7; Sodium 131   Recent Lipid Panel No results found for: CHOL, TRIG, HDL, CHOLHDL, LDLCALC, LDLDIRECT  Wt Readings from Last 3 Encounters:  02/20/21 66 kg  01/07/21 68 kg  12/05/20 68 kg     Objective:    Vital Signs:  BP 118/64   Pulse 82   Ht 5\' 3"  (1.6 m)   Wt 66 kg   SpO2 96%   BMI 25.76 kg/m    Affect appropriate Healthy:  appears stated age HEENT: normal Neck supple with no adenopathy JVP normal no bruits no thyromegaly Lungs clear with no wheezing and good diaphragmatic motion Heart:  S1/S2 preserved moderate AS murmur, no rub, gallop or click PMI normal Abdomen: benighn, BS positve, no tenderness, no AAA no bruit.  No HSM or HJR Distal pulses intact with no bruits No edema Neuro non-focal Skin warm and dry Right THR    ASSESSMENT & PLAN:    AR/AS:  Moderate AS slight progression of gradients on TTE done 11/24/20 mean 27.4 peak 47.2 mmHg AVA 1.1 cm2 DVI 0.31 F/U echo in a  year unless she develops symptoms   HTN:  Well controlled.  Continue current medications and low sodium Dash type diet.    Thyroid:  On replacement TSH with primary in [place] dose recently decreased   ChoL  On statin Cholesterol is at goal.  Continue current dose of statin and diet Rx.  No myalgias or side effects.  F/U  Labs with primary   COVID-19 Education: The signs and symptoms of COVID-19 were discussed with the patient and how to seek care for testing (follow up with PCP or arrange E-visit).  The importance of social distancing was  discussed today.    Medication Adjustments/Labs and Tests Ordered: Current medicines are reviewed at length with the patient today.  Concerns regarding medicines are outlined above.   Tests Ordered: No orders of the defined types were placed in this encounter. Echo for AS May 2023   Medication Changes: No orders of the defined types were placed in this encounter.   Disposition:  Follow up  in a year with echo for AS  if this year's stable   Signed, Charlton Haws, MD  02/20/2021 9:46 AM    [facility]

## 2021-02-20 ENCOUNTER — Other Ambulatory Visit: Payer: Self-pay

## 2021-02-20 ENCOUNTER — Ambulatory Visit: Payer: Medicare Other | Admitting: Cardiovascular Disease

## 2021-02-20 ENCOUNTER — Encounter: Payer: Self-pay | Admitting: Cardiovascular Disease

## 2021-02-20 VITALS — BP 118/64 | HR 82 | Ht 63.0 in | Wt 145.4 lb

## 2021-02-20 DIAGNOSIS — I10 Essential (primary) hypertension: Secondary | ICD-10-CM | POA: Diagnosis not present

## 2021-02-20 DIAGNOSIS — I350 Nonrheumatic aortic (valve) stenosis: Secondary | ICD-10-CM | POA: Diagnosis not present

## 2021-02-20 DIAGNOSIS — E782 Mixed hyperlipidemia: Secondary | ICD-10-CM | POA: Diagnosis not present

## 2021-02-20 NOTE — Patient Instructions (Addendum)
Medication Instructions:  Your physician recommends that you continue on your current medications as directed. Please refer to the Current Medication list given to you today.  *If you need a refill on your cardiac medications before your next appointment, please call your pharmacy*  Lab Work: If you have labs (blood work) drawn today and your tests are completely normal, you will receive your results only by: MyChart Message (if you have MyChart) OR A paper copy in the mail If you have any lab test that is abnormal or we need to change your treatment, we will call you to review the results.  Testing/Procedures: Your physician has requested that you have an echocardiogram with office visit same day in May. Echocardiography is a painless test that uses sound waves to create images of your heart. It provides your doctor with information about the size and shape of your heart and how well your heart's chambers and valves are working. This procedure takes approximately one hour. There are no restrictions for this procedure.  Follow-Up: At [facility], you and your health needs are our priority.  As part of our continuing mission to provide you with exceptional heart care, we have created designated Provider Care Teams.  These Care Teams include your primary Cardiologist (physician) and Advanced Practice Providers (APPs -  Physician Assistants and Nurse Practitioners) who all work together to provide you with the care you need, when you need it.  We recommend signing up for the patient portal called "MyChart".  Sign up information is provided on this After Visit Summary.  MyChart is used to connect with patients for Virtual Visits (Telemedicine).  Patients are able to view lab/test results, encounter notes, upcoming appointments, etc.  Non-urgent messages can be sent to your provider as well.   To learn more about what you can do with MyChart, go to [email or web address].    Your next appointment:    May  The format for your next appointment:   In Person  Provider:   You may see Charlton Haws, MD or one of the following Advanced Practice Providers on your designated Care Team:   Nada Boozer, NP

## 2021-03-11 DIAGNOSIS — D2239 Melanocytic nevi of other parts of face: Secondary | ICD-10-CM | POA: Diagnosis not present

## 2021-03-11 DIAGNOSIS — D1801 Hemangioma of skin and subcutaneous tissue: Secondary | ICD-10-CM | POA: Diagnosis not present

## 2021-03-11 DIAGNOSIS — L814 Other melanin hyperpigmentation: Secondary | ICD-10-CM | POA: Diagnosis not present

## 2021-03-11 DIAGNOSIS — D225 Melanocytic nevi of trunk: Secondary | ICD-10-CM | POA: Diagnosis not present

## 2021-03-31 DIAGNOSIS — E7800 Pure hypercholesterolemia, unspecified: Secondary | ICD-10-CM | POA: Diagnosis not present

## 2021-03-31 DIAGNOSIS — I10 Essential (primary) hypertension: Secondary | ICD-10-CM | POA: Diagnosis not present

## 2021-03-31 DIAGNOSIS — E039 Hypothyroidism, unspecified: Secondary | ICD-10-CM | POA: Diagnosis not present

## 2021-04-03 DIAGNOSIS — E7800 Pure hypercholesterolemia, unspecified: Secondary | ICD-10-CM | POA: Diagnosis not present

## 2021-04-03 DIAGNOSIS — Z23 Encounter for immunization: Secondary | ICD-10-CM | POA: Diagnosis not present

## 2021-04-03 DIAGNOSIS — E039 Hypothyroidism, unspecified: Secondary | ICD-10-CM | POA: Diagnosis not present

## 2021-04-03 DIAGNOSIS — I10 Essential (primary) hypertension: Secondary | ICD-10-CM | POA: Diagnosis not present

## 2021-05-15 DIAGNOSIS — E039 Hypothyroidism, unspecified: Secondary | ICD-10-CM | POA: Diagnosis not present

## 2021-09-28 DIAGNOSIS — E7800 Pure hypercholesterolemia, unspecified: Secondary | ICD-10-CM | POA: Diagnosis not present

## 2021-09-28 DIAGNOSIS — E039 Hypothyroidism, unspecified: Secondary | ICD-10-CM | POA: Diagnosis not present

## 2021-09-28 DIAGNOSIS — I10 Essential (primary) hypertension: Secondary | ICD-10-CM | POA: Diagnosis not present

## 2021-10-02 DIAGNOSIS — E039 Hypothyroidism, unspecified: Secondary | ICD-10-CM | POA: Diagnosis not present

## 2021-10-02 DIAGNOSIS — I10 Essential (primary) hypertension: Secondary | ICD-10-CM | POA: Diagnosis not present

## 2021-10-02 DIAGNOSIS — E7800 Pure hypercholesterolemia, unspecified: Secondary | ICD-10-CM | POA: Diagnosis not present

## 2021-10-02 DIAGNOSIS — M810 Age-related osteoporosis without current pathological fracture: Secondary | ICD-10-CM | POA: Diagnosis not present

## 2021-11-02 DIAGNOSIS — E039 Hypothyroidism, unspecified: Secondary | ICD-10-CM | POA: Diagnosis not present

## 2021-11-16 ENCOUNTER — Ambulatory Visit (HOSPITAL_COMMUNITY): Payer: Medicare Other | Attending: Internal Medicine

## 2021-11-16 ENCOUNTER — Telehealth: Payer: Self-pay

## 2021-11-16 DIAGNOSIS — I10 Essential (primary) hypertension: Secondary | ICD-10-CM | POA: Insufficient documentation

## 2021-11-16 DIAGNOSIS — I350 Nonrheumatic aortic (valve) stenosis: Secondary | ICD-10-CM

## 2021-11-16 DIAGNOSIS — E782 Mixed hyperlipidemia: Secondary | ICD-10-CM | POA: Insufficient documentation

## 2021-11-16 DIAGNOSIS — E785 Hyperlipidemia, unspecified: Secondary | ICD-10-CM

## 2021-11-16 LAB — ECHOCARDIOGRAM COMPLETE
AR max vel: 1.01 cm2
AV Area VTI: 1.01 cm2
AV Area mean vel: 1.07 cm2
AV Mean grad: 34 mmHg
AV Peak grad: 58.4 mmHg
Ao pk vel: 3.82 m/s
Area-P 1/2: 1.95 cm2
S' Lateral: 2.6 cm

## 2021-11-16 NOTE — Telephone Encounter (Signed)
The patient has been notified of the result and verbalized understanding.  All questions (if any) were answered. ?Ethelda Chick, RN 11/16/2021 5:39 PM  ? ?Patient will get lab work this week at [facility]. Ordered repeat echo for 6 months and reschedule patient's office visit for then. ?

## 2021-11-16 NOTE — Telephone Encounter (Signed)
-----   Message from Josue Hector, MD sent at 11/16/2021  5:12 PM EDT ----- ?AS worse moderate to severe no surgical some calcium on MV as well heart pressures noted to be elevated needs BNP/and BMET will likely start diuretic pending results of labs F/U with me and echo same day 6 months  ? ?

## 2021-11-17 ENCOUNTER — Other Ambulatory Visit: Payer: Self-pay | Admitting: *Deleted

## 2021-11-17 DIAGNOSIS — I10 Essential (primary) hypertension: Secondary | ICD-10-CM | POA: Diagnosis not present

## 2021-11-17 DIAGNOSIS — I350 Nonrheumatic aortic (valve) stenosis: Secondary | ICD-10-CM | POA: Diagnosis not present

## 2021-11-17 DIAGNOSIS — E785 Hyperlipidemia, unspecified: Secondary | ICD-10-CM | POA: Diagnosis not present

## 2021-11-17 DIAGNOSIS — E782 Mixed hyperlipidemia: Secondary | ICD-10-CM | POA: Diagnosis not present

## 2021-11-18 LAB — BASIC METABOLIC PANEL
BUN/Creatinine Ratio: 24 (ref 12–28)
BUN: 16 mg/dL (ref 8–27)
CO2: 22 mmol/L (ref 20–29)
Calcium: 9.5 mg/dL (ref 8.7–10.3)
Chloride: 104 mmol/L (ref 96–106)
Creatinine, Ser: 0.67 mg/dL (ref 0.57–1.00)
Glucose: 85 mg/dL (ref 70–99)
Potassium: 4.2 mmol/L (ref 3.5–5.2)
Sodium: 141 mmol/L (ref 134–144)
eGFR: 91 mL/min/{1.73_m2} (ref >59)

## 2021-11-18 LAB — PRO B NATRIURETIC PEPTIDE: NT-Pro BNP: 118 pg/mL (ref 0–738)

## 2021-12-28 DIAGNOSIS — Z1331 Encounter for screening for depression: Secondary | ICD-10-CM | POA: Diagnosis not present

## 2021-12-28 DIAGNOSIS — E785 Hyperlipidemia, unspecified: Secondary | ICD-10-CM | POA: Diagnosis not present

## 2021-12-28 DIAGNOSIS — Z0000 Encounter for general adult medical examination without abnormal findings: Secondary | ICD-10-CM | POA: Diagnosis not present

## 2021-12-31 ENCOUNTER — Ambulatory Visit: Payer: Medicare Other | Admitting: Cardiovascular Disease

## 2022-01-13 DIAGNOSIS — Z1231 Encounter for screening mammogram for malignant neoplasm of breast: Secondary | ICD-10-CM | POA: Diagnosis not present

## 2022-04-05 DIAGNOSIS — E7800 Pure hypercholesterolemia, unspecified: Secondary | ICD-10-CM | POA: Diagnosis not present

## 2022-04-05 DIAGNOSIS — I10 Essential (primary) hypertension: Secondary | ICD-10-CM | POA: Diagnosis not present

## 2022-04-05 DIAGNOSIS — E039 Hypothyroidism, unspecified: Secondary | ICD-10-CM | POA: Diagnosis not present

## 2022-04-07 DIAGNOSIS — E7800 Pure hypercholesterolemia, unspecified: Secondary | ICD-10-CM | POA: Diagnosis not present

## 2022-04-07 DIAGNOSIS — E039 Hypothyroidism, unspecified: Secondary | ICD-10-CM | POA: Diagnosis not present

## 2022-04-07 DIAGNOSIS — I10 Essential (primary) hypertension: Secondary | ICD-10-CM | POA: Diagnosis not present

## 2022-04-07 DIAGNOSIS — M810 Age-related osteoporosis without current pathological fracture: Secondary | ICD-10-CM | POA: Diagnosis not present

## 2022-05-07 DIAGNOSIS — E039 Hypothyroidism, unspecified: Secondary | ICD-10-CM | POA: Diagnosis not present

## 2022-05-21 NOTE — Progress Notes (Signed)
Date:  05/31/2022   ID:  Holly Patrick, DOB 1945/11/21, MRN [id number]  PCP:  Cyndi Bender, PA-C  Cardiologist:  Johnsie Cancel Electrophysiologist:  None   Evaluation Performed:  Follow-Up Visit  Chief Complaint:  Aortic Valve Disease   History of Present Illness:    76 y.o. followed since Sep 03, 2017 for aortic stenosis . 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 Husband died in 04-Sep-2019 They were married for 58 years Son and daughter helping with farm   TTE 11/24/20 showed some progression but still in moderate range Mean gradient 27 peak 47 mmHg DVI 0.31 and AVA 1.1 cm2 TTE 11/16/21 EF 60-65% mean gradient 34 peak 58 DVI 0.29 and AVA 1 cm2  She has no dyspnea, syncope chest pain or palpitations   Had right THR with Dr Lorin Mercy recovered well   LDL 79    TTE done 05/31/22 with no change in gradients normal EF severe MAC mild MR  Mean 33 peak 57.8 DVI 0.31 AVA 0.90   Doing well has 2 kids locally Mild fatigue no untoward dyspnea chest pain or syncope  Discussed likely need for TAVR within 2 years    Past Medical History:  Diagnosis Date   Arthritis    right hip   Hypertension    Hypothyroidism    Osteoporosis    Sleep apnea    before weight loss- does not wear cpap anymore after losing 40 lbs   Past Surgical History:  Procedure Laterality Date   BREAST BIOPSY Bilateral    PARTIAL HYSTERECTOMY  1980's   TOTAL HIP ARTHROPLASTY Right 11/28/2020   Procedure: RIGHT TOTAL HIP ARTHROPLASTY ANTERIOR APPROACH;  Surgeon: Marybelle Killings, MD;  Location: [facility];  Service: Orthopedics;  Laterality: Right;   TUBAL LIGATION       Current Meds  Medication Sig   amLODipine (NORVASC) 10 MG tablet Take 10 mg by mouth daily.   aspirin EC 81 MG tablet Take 81 mg by mouth daily. Swallow whole.   atorvastatin (LIPITOR) 10 MG tablet Take 10 mg by mouth daily.   benazepril (LOTENSIN) 20 MG tablet Take 20 mg by mouth daily.   levothyroxine (SYNTHROID) 112 MCG tablet Take 112 mcg by  mouth daily.     Allergies:   Patient has no known allergies.   Social History   Tobacco Use   Smoking status: Never   Smokeless tobacco: Never  Vaping Use   Vaping Use: Never used  Substance Use Topics   Alcohol use: No   Drug use: No     Family Hx: The patient's family history includes Alzheimer's disease in her mother; Stroke in her father.  ROS:   Please see the history of present illness.     All other systems reviewed and are negative.   Prior CV studies:   The following studies were reviewed today:  TTE  11/24/20 compared to 11/16/21   Labs/Other Tests and Data Reviewed:    EKG:  SR rate 68 normal no LVH 11/21/17  Recent Labs: 11/17/2021: BUN 16; Creatinine, Ser 0.67; NT-Pro BNP 118; Potassium 4.2; Sodium 141   Recent Lipid Panel No results found for: "CHOL", "TRIG", "HDL", "CHOLHDL", "LDLCALC", "LDLDIRECT"  Wt Readings from Last 3 Encounters:  05/31/22 152 lb (68.9 kg)  02/20/21 145 lb 6.4 oz (66 kg)  01/07/21 150 lb (68 kg)     Objective:    Vital Signs:  BP 138/78   Pulse 79   Ht _0  (  1.6 m)   Wt 152 lb (68.9 kg)   SpO2 95%   BMI 26.93 kg/m    Affect appropriate Healthy:  appears stated age 5: normal Neck supple with no adenopathy JVP normal no bruits no thyromegaly Lungs clear with no wheezing and good diaphragmatic motion Heart:  S1/S2 preserved moderate AS murmur, no rub, gallop or click PMI normal Abdomen: benighn, BS positve, no tenderness, no AAA no bruit.  No HSM or HJR Distal pulses intact with no bruits No edema Neuro non-focal Skin warm and dry Right THR    ASSESSMENT & PLAN:    AR/AS:  Moderate- Severe  AS slight progression of gradients on TTE done 11/16/21 Discussed likely need for ? TAVR in next year or 2  TTE today no change in gradients see above  HTN:  Well controlled.  Continue current medications and low sodium Dash type diet.    Thyroid:  On replacement TSH with primary in [place] dose recently decreased    ChoL  On statin Cholesterol is at goal.  Continue current dose of statin and diet Rx.  No myalgias or side effects.  F/U  Labs with primary    Medication Adjustments/Labs and Tests Ordered: Current medicines are reviewed at length with the patient today.  Concerns regarding medicines are outlined above.   Tests Ordered: No orders of the defined types were placed in this encounter.  Echo for AS May 2024   Medication Changes: No orders of the defined types were placed in this encounter.    Disposition:  Follow up in 6 months with echo   Signed, Jenkins Rouge, MD  05/31/2022 9:08 AM    [place]

## 2022-05-31 ENCOUNTER — Ambulatory Visit (HOSPITAL_COMMUNITY): Payer: Medicare Other | Attending: Cardiovascular Disease | Admitting: Cardiovascular Disease

## 2022-05-31 ENCOUNTER — Ambulatory Visit (HOSPITAL_BASED_OUTPATIENT_CLINIC_OR_DEPARTMENT_OTHER): Payer: Medicare Other

## 2022-05-31 ENCOUNTER — Encounter: Payer: Self-pay | Admitting: Cardiovascular Disease

## 2022-05-31 VITALS — BP 138/78 | HR 79 | Ht 63.0 in | Wt 152.0 lb

## 2022-05-31 DIAGNOSIS — E782 Mixed hyperlipidemia: Secondary | ICD-10-CM | POA: Diagnosis not present

## 2022-05-31 DIAGNOSIS — E785 Hyperlipidemia, unspecified: Secondary | ICD-10-CM

## 2022-05-31 DIAGNOSIS — I10 Essential (primary) hypertension: Secondary | ICD-10-CM

## 2022-05-31 DIAGNOSIS — I350 Nonrheumatic aortic (valve) stenosis: Secondary | ICD-10-CM

## 2022-05-31 LAB — ECHOCARDIOGRAM COMPLETE
AR max vel: 0.76 cm2
AV Area VTI: 0.88 cm2
AV Area mean vel: 0.79 cm2
AV Mean grad: 33 mmHg
AV Peak grad: 57.8 mmHg
Ao pk vel: 3.8 m/s
Area-P 1/2: 2.88 cm2
S' Lateral: 2.2 cm

## 2022-05-31 NOTE — Patient Instructions (Signed)
Medication Instructions:  Your physician recommends that you continue on your current medications as directed. Please refer to the Current Medication list given to you today.  *If you need a refill on your cardiac medications before your next appointment, please call your pharmacy*  Lab Work: If you have labs (blood work) drawn today and your tests are completely normal, you will receive your results only by: MyChart Message (if you have MyChart) OR A paper copy in the mail If you have any lab test that is abnormal or we need to change your treatment, we will call you to review the results.  Testing/Procedures: None ordered today.  Follow-Up: At [facility], you and your health needs are our priority.  As part of our continuing mission to provide you with exceptional heart care, we have created designated Provider Care Teams.  These Care Teams include your primary Cardiologist (physician) and Advanced Practice Providers (APPs -  Physician Assistants and Nurse Practitioners) who all work together to provide you with the care you need, when you need it.  We recommend signing up for the patient portal called "MyChart".  Sign up information is provided on this After Visit Summary.  MyChart is used to connect with patients for Virtual Visits (Telemedicine).  Patients are able to view lab/test results, encounter notes, upcoming appointments, etc.  Non-urgent messages can be sent to your provider as well.   To learn more about what you can do with MyChart, go to [email or web address].    Your next appointment:    6 month(s)  The format for your next appointment:   In Person  Provider:   Peter Nishan, MD     Important Information About Sugar       

## 2022-07-09 IMAGING — CR DG CHEST 2V
2 series · 2 of 2 positions shown · non-contrast
Comparison: None.

CLINICAL DATA: Preoperative assessment for hip replacement surgery,
history of sleep apnea, hypertension

EXAM:
CHEST - 2 VIEW

[w chest pa]
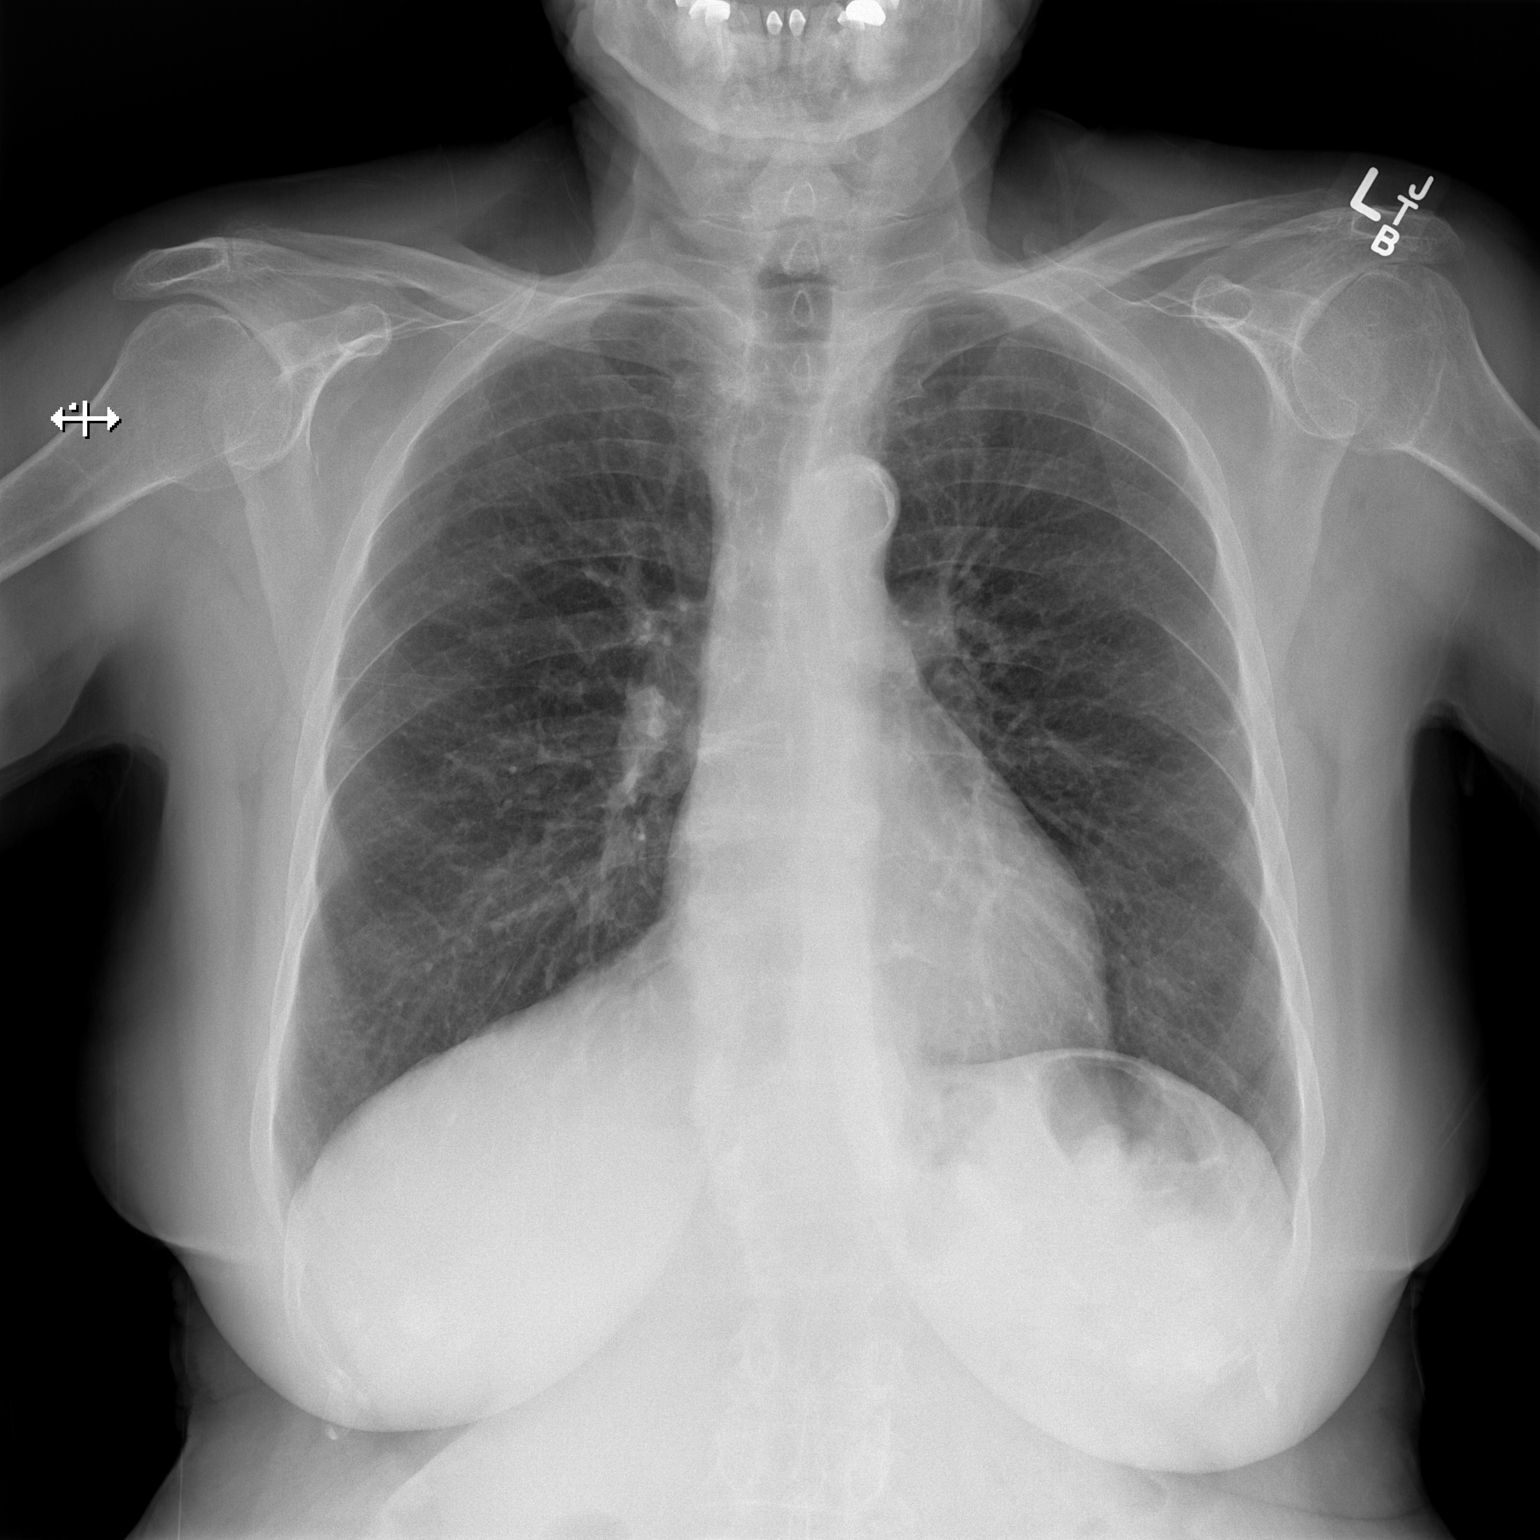

[w chest lat]
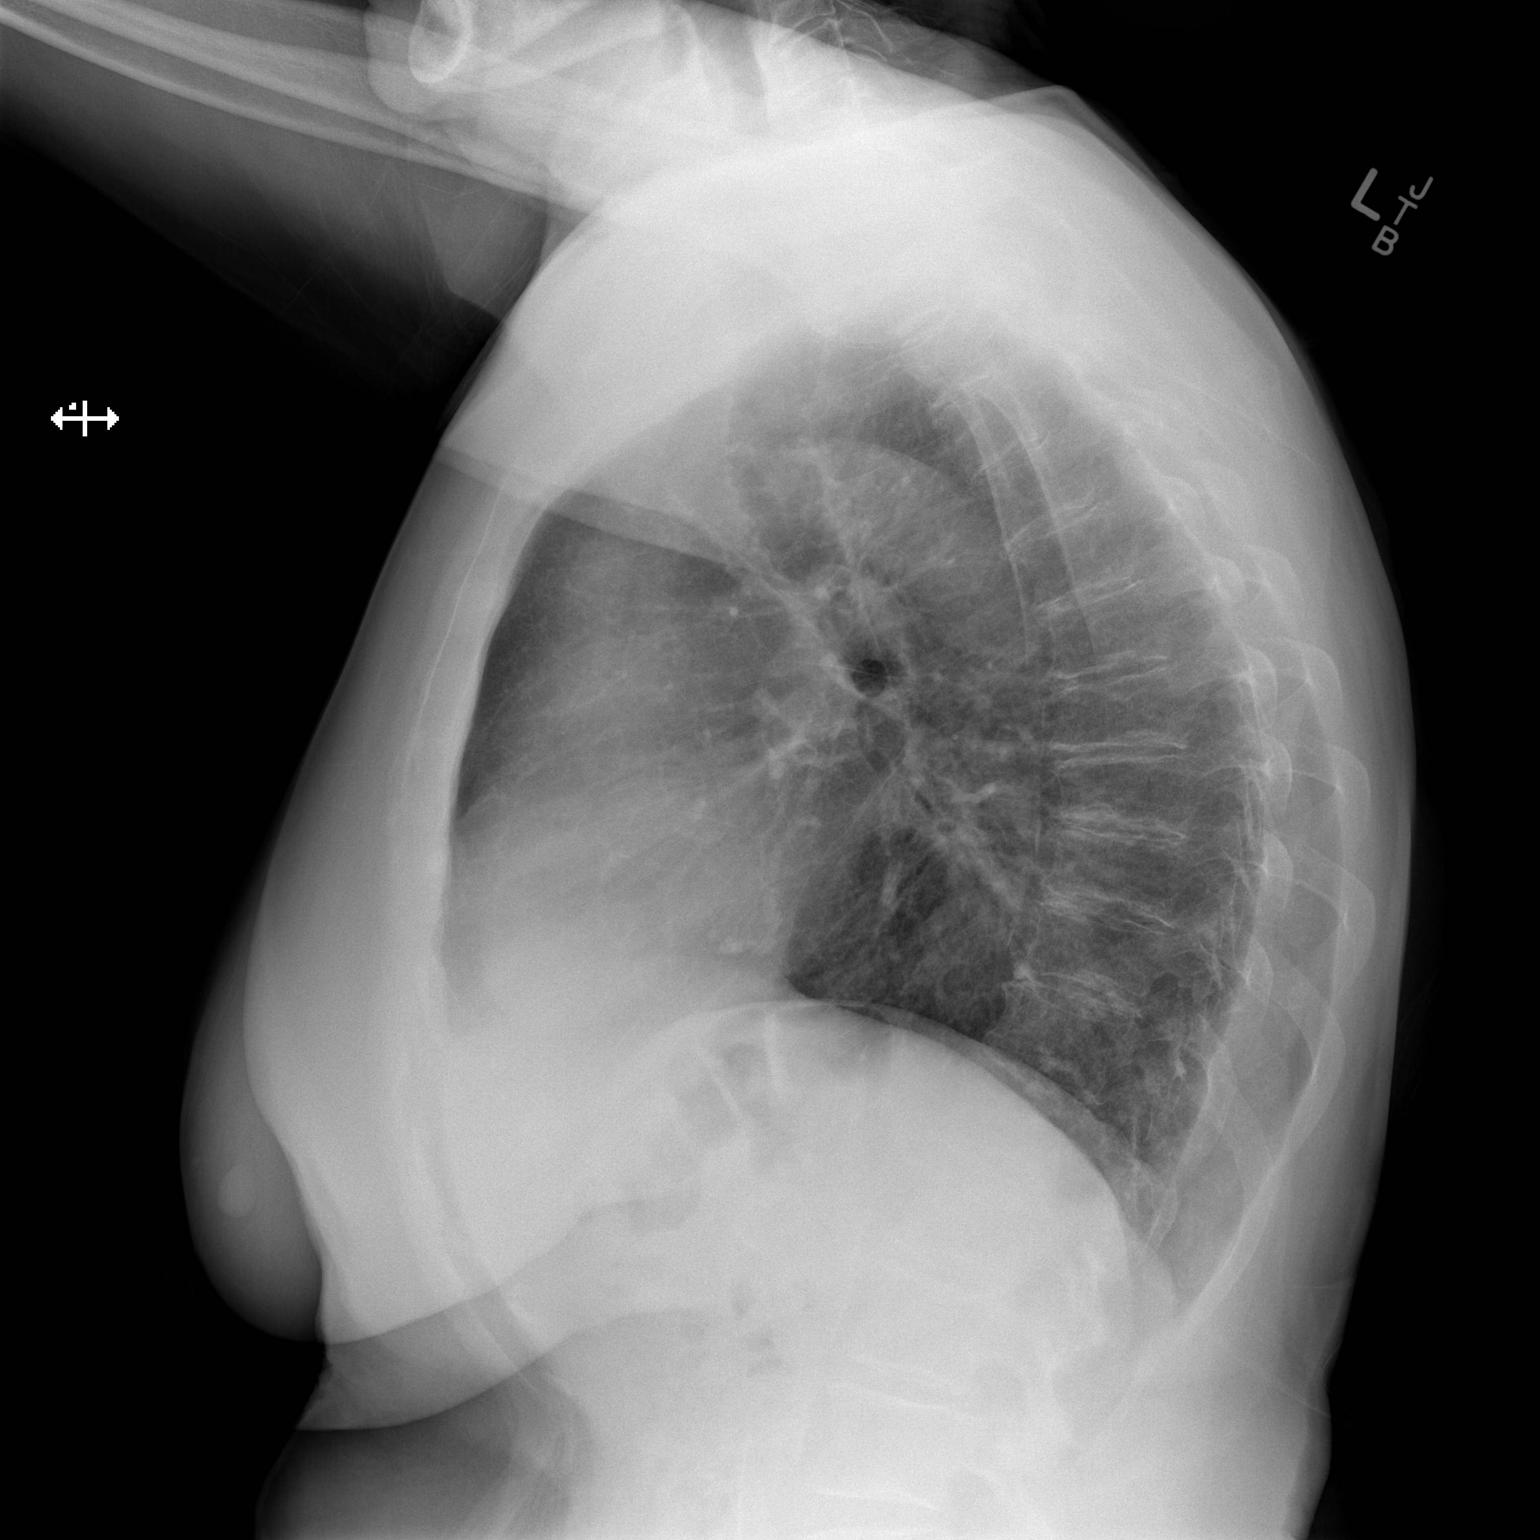

[2 of 2 positions shown; findings below may reference images not displayed]

FINDINGS: The heart size and mediastinal contours are within normal limits.
Both lungs are clear. The visualized skeletal structures are
unremarkable.
IMPRESSION: No active cardiopulmonary disease.

## 2022-07-12 IMAGING — RF DG HIP (WITH PELVIS) OPERATIVE*R*
1 series · 5 of 5 positions shown · non-contrast
Comparison: Radiographs of the right hip 11/04/2020.

CLINICAL DATA: Elective surgery. Additional history provided: Right
total hip arthroplasty anterior approach. Provided fluoroscopy time
16 seconds (1.52 mGy).

EXAM:
OPERATIVE right HIP (WITH PELVIS IF PERFORMED) 5 VIEWS
TECHNIQUE: Fluoroscopic spot image(s) were submitted for interpretation
post-operatively.

[Series 1: unknown protocol · 0.20mm/px · 5 of 5 slices shown]
[im 1/5]
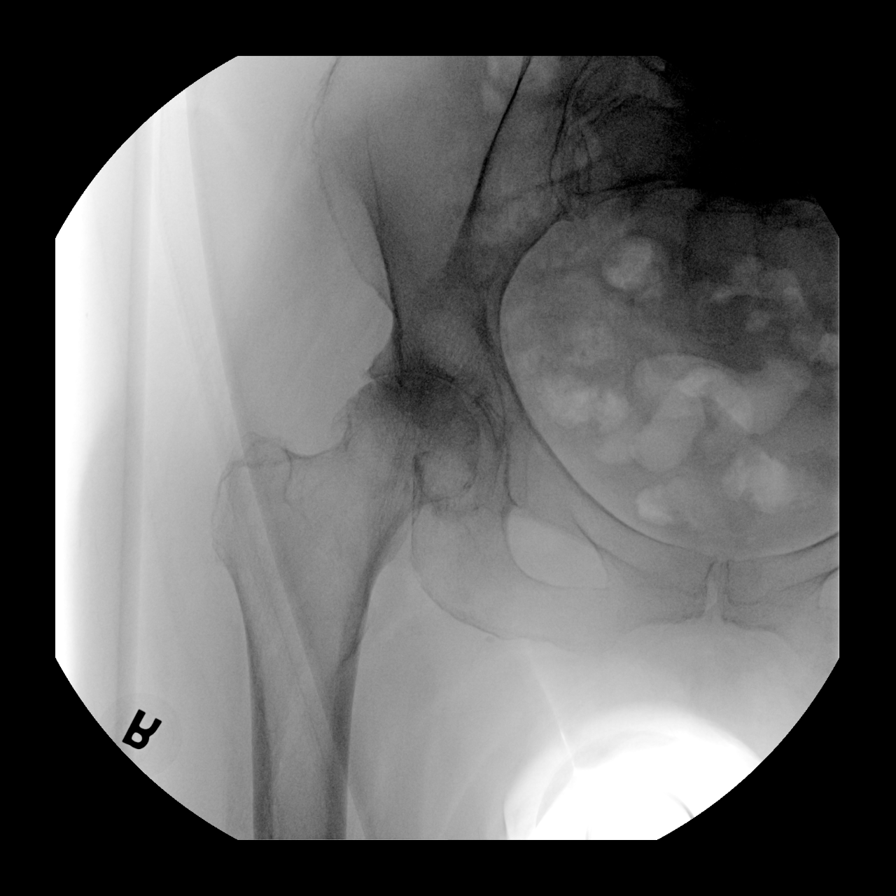
[im 2/5]
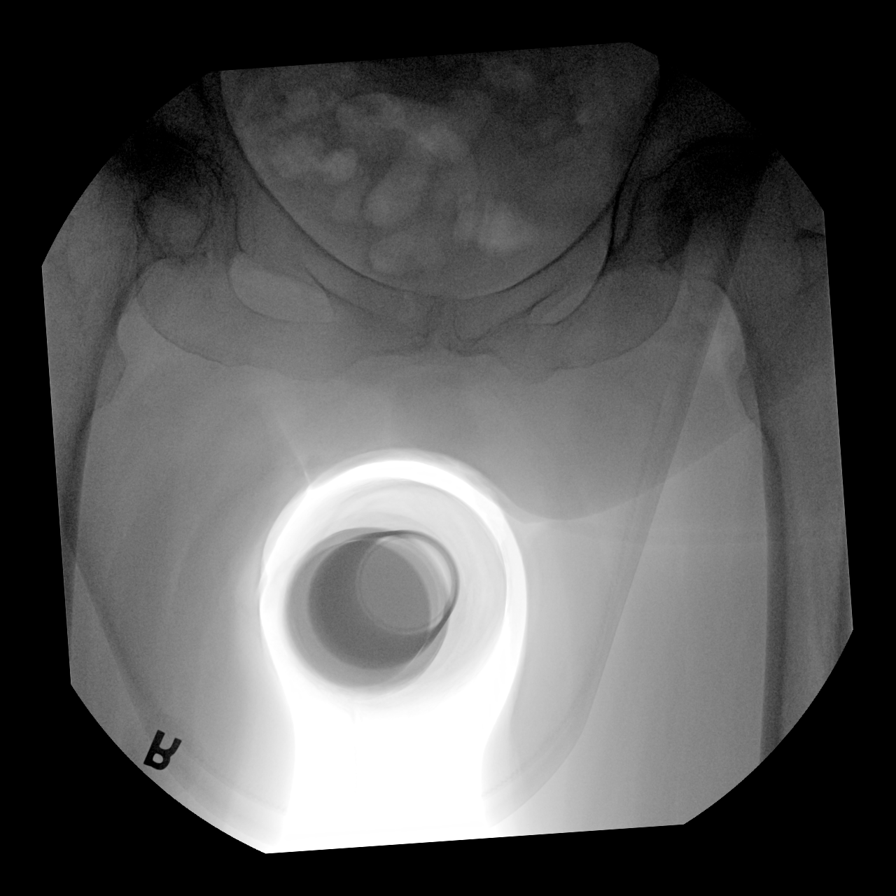
[im 3/5]
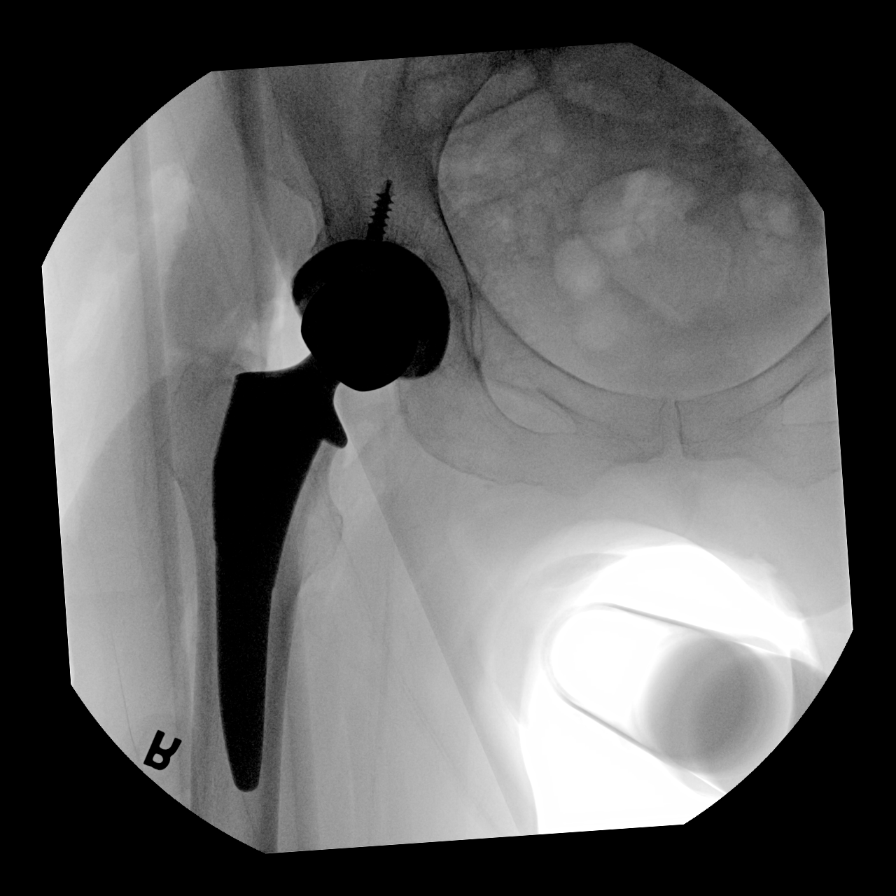
[im 4/5]
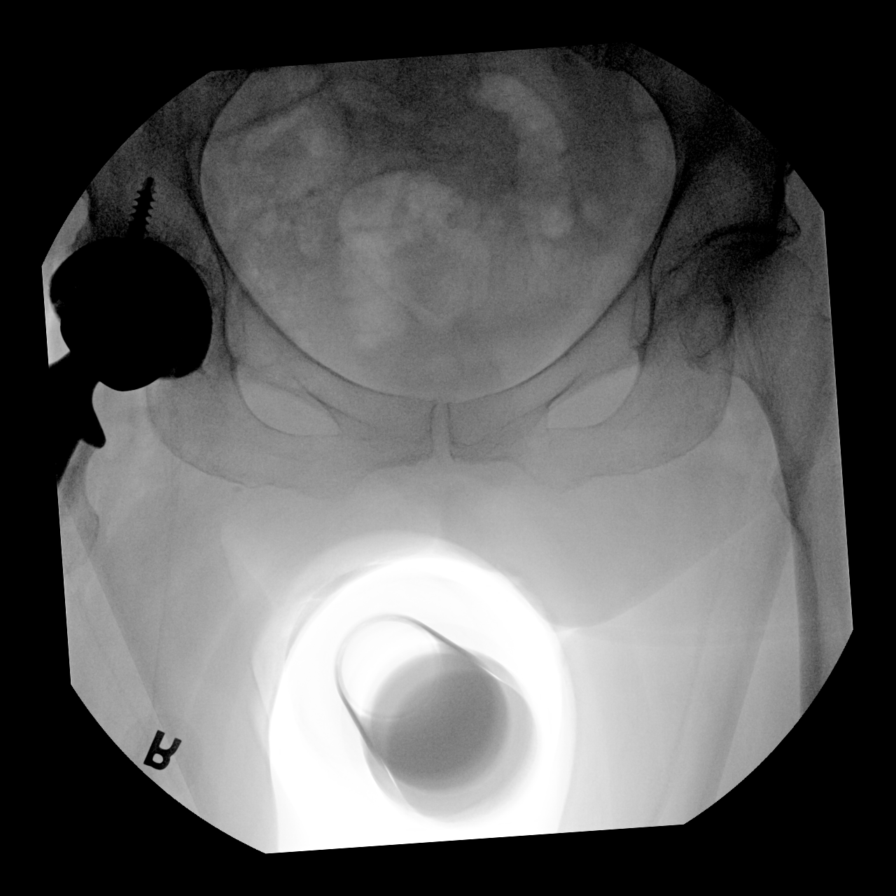
[im 5/5]
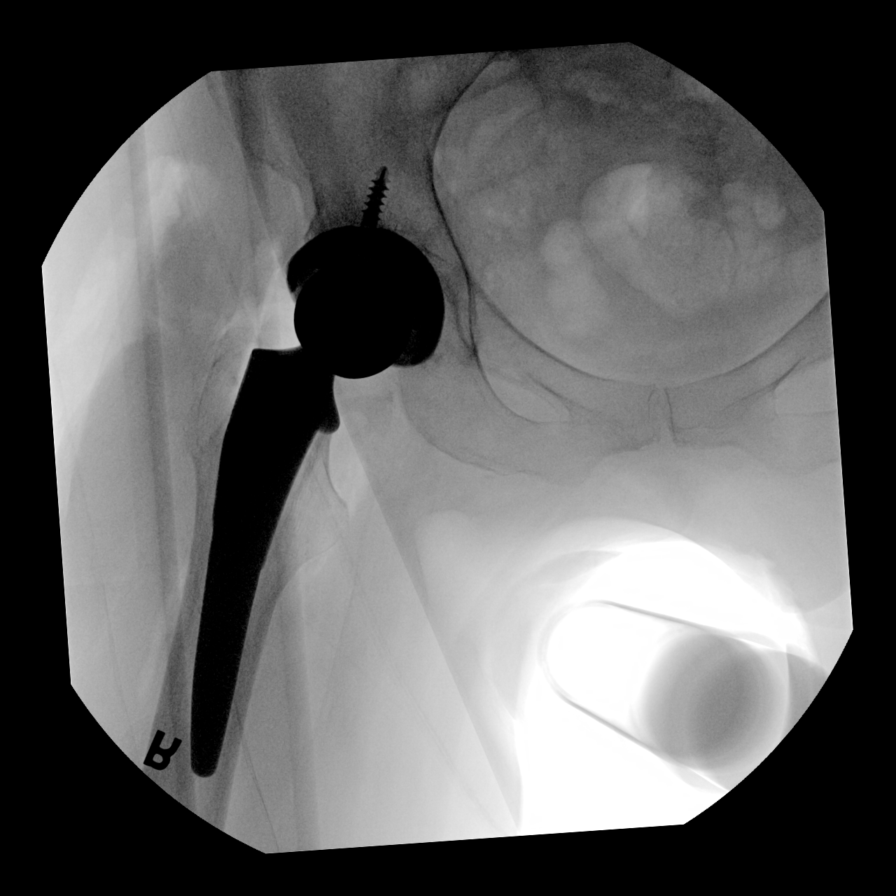

[5 of 5 positions shown; findings below may reference images not displayed]

FINDINGS: Five intraoperative fluoroscopic images of the right hip are
submitted. On the provided images, there are findings of interval
right total hip arthroplasty. The femoral and acetabular components
appear well seated. No unexpected finding on the provided views.
IMPRESSION: Five intraoperative fluoroscopic images of the right hip from right
total hip arthroplasty, as described.

## 2022-10-04 DIAGNOSIS — E039 Hypothyroidism, unspecified: Secondary | ICD-10-CM | POA: Diagnosis not present

## 2022-10-04 DIAGNOSIS — I10 Essential (primary) hypertension: Secondary | ICD-10-CM | POA: Diagnosis not present

## 2022-10-04 DIAGNOSIS — E7800 Pure hypercholesterolemia, unspecified: Secondary | ICD-10-CM | POA: Diagnosis not present

## 2022-10-06 DIAGNOSIS — I10 Essential (primary) hypertension: Secondary | ICD-10-CM | POA: Diagnosis not present

## 2022-10-06 DIAGNOSIS — E7800 Pure hypercholesterolemia, unspecified: Secondary | ICD-10-CM | POA: Diagnosis not present

## 2022-10-06 DIAGNOSIS — M810 Age-related osteoporosis without current pathological fracture: Secondary | ICD-10-CM | POA: Diagnosis not present

## 2022-10-06 DIAGNOSIS — E039 Hypothyroidism, unspecified: Secondary | ICD-10-CM | POA: Diagnosis not present

## 2022-12-17 ENCOUNTER — Telehealth: Payer: Self-pay

## 2022-12-17 DIAGNOSIS — I350 Nonrheumatic aortic (valve) stenosis: Secondary | ICD-10-CM

## 2022-12-17 NOTE — Telephone Encounter (Signed)
Per Dr. Eden Emms, patient needs echo for AS. Called patient and left message for her to call back. Will place order. Patient will need to be schedule before her appointment on 12/27/22.

## 2022-12-21 DIAGNOSIS — H2513 Age-related nuclear cataract, bilateral: Secondary | ICD-10-CM | POA: Diagnosis not present

## 2022-12-27 ENCOUNTER — Ambulatory Visit: Payer: Medicare Other | Admitting: Cardiovascular Disease

## 2023-02-14 ENCOUNTER — Ambulatory Visit (HOSPITAL_COMMUNITY): Payer: Medicare Other | Attending: Cardiovascular Disease

## 2023-02-14 DIAGNOSIS — I350 Nonrheumatic aortic (valve) stenosis: Secondary | ICD-10-CM | POA: Diagnosis not present

## 2023-02-14 LAB — ECHOCARDIOGRAM COMPLETE
AR max vel: 0.66 cm2
AV Area VTI: 0.69 cm2
AV Area mean vel: 0.63 cm2
AV Mean grad: 52 mmHg
AV Peak grad: 85 mmHg
Ao pk vel: 4.61 m/s
Area-P 1/2: 2.39 cm2
P 1/2 time: 454 msec
S' Lateral: 2 cm

## 2023-02-16 NOTE — H&P (View-Only) (Signed)
 Date:  02/16/2023   ID:  Holly Patrick, DOB [birth year], MRN [id number]  PCP:  Lonie Peak, PA-C  Cardiologist:  Eden Emms Electrophysiologist:  None   Evaluation Performed:  Follow-Up Visit  Chief Complaint:  Aortic Valve Disease   History of Present Illness:    77 y.o. followed since 01-Mar-2018 for aortic stenosis . 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 Husband died in 03-01-2020 They were married for 8 years Son and daughter helping with farm   TTE 11/24/20 showed some progression but still in moderate range Mean gradient 27 peak 47 mmHg DVI 0.31 and AVA 1.1 cm2 TTE 11/16/21 EF 60-65% mean gradient 34 peak 58 DVI 0.29 and AVA 1 cm2  Had right THR with Dr Ophelia Charter recovered well   LDL 79    TTE done 05/31/22 with no change in gradients normal EF severe MAC mild MR  Mean 33 peak 57.8 DVI 0.31 AVA 0.90  TTE 02/14/23 mean gradient 52 mmHg peak 85 mmHg DVI 0.22 AVA 0.69 cm2  Significant progression AS. Discussed right and left cath Cardiac CTA and referral for TAVR.   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 Has had more dyspnea and fatigue but despite jump in  Gradient her functional capacity is still quite good   Long discussion with her two children and patient about w/u for TAVR to include right/left cath this week CTA next week and consults with CVTS.   Shared Decision Making/Informed Consent The risks [stroke (1 in 1000), death (1 in 1000), kidney failure [usually temporary] (1 in 500), bleeding (1 in 200), allergic reaction [possibly serious] (1 in 200)], benefits (diagnostic support and management of coronary artery disease) and alternatives of a cardiac catheterization were discussed in detail with Holly Patrick and she is willing to proceed.     Past Medical History:  Diagnosis Date   Arthritis    right hip   Hypertension    Hypothyroidism    Osteoporosis    Sleep apnea    before weight loss- does not wear cpap anymore after losing 40 lbs   Past Surgical  History:  Procedure Laterality Date   BREAST BIOPSY Bilateral    PARTIAL HYSTERECTOMY  1980's   TOTAL HIP ARTHROPLASTY Right 11/28/2020   Procedure: RIGHT TOTAL HIP ARTHROPLASTY ANTERIOR APPROACH;  Surgeon: Eldred Manges, MD;  [facility];  Service: Orthopedics;  Laterality: Right;   TUBAL LIGATION       No outpatient medications have been marked as taking for the 02/21/23 encounter (Appointment) with Wendall Stade, MD.     Allergies:   Patient has no known allergies.   Social History   Tobacco Use   Smoking status: Never   Smokeless tobacco: Never  Vaping Use   Vaping status: Never Used  Substance Use Topics   Alcohol use: No   Drug use: No     Family Hx: The patient's family history includes Alzheimer's disease in her mother; Stroke in her father.  ROS:   Please see the history of present illness.     All other systems reviewed and are negative.   Prior CV studies:   The following studies were reviewed today:  TTE  02/14/23 AV Area (Vmax):    0.66 cm  AV Area (Vmean):   0.63 cm  AV Area (VTI):     0.69 cm  AV Vmax:           461.00 cm/s  AV  Vmean:          316.500 cm/s  AV VTI:            1.066 m  AV Peak Grad:      85.0 mmHg  AV Mean Grad:      52.0 mmHg  LVOT Vmax:         96.70 cm/s  LVOT Vmean:        63.000 cm/s  LVOT VTI:          0.235 m  LVOT/AV VTI ratio: 0.22   Labs/Other Tests and Data Reviewed:    EKG:  SR rate 68 normal no LVH 11/21/17  Recent Labs: No results found for requested labs within last 365 days.   Recent Lipid Panel No results found for: "CHOL", "TRIG", "HDL", "CHOLHDL", "LDLCALC", "LDLDIRECT"  Wt Readings from Last 3 Encounters:  05/31/22 152 lb (68.9 kg)  02/20/21 145 lb 6.4 oz (66 kg)  01/07/21 150 lb (68 kg)     Objective:    Vital Signs:  There were no vitals taken for this visit.   Affect appropriate Healthy:  appears stated age HEENT: normal Neck supple with no adenopathy JVP normal no bruits no  thyromegaly Lungs clear with no wheezing and good diaphragmatic motion Heart:  S1/S2 muffled late peaking AS murmur , no rub, gallop or click PMI normal Abdomen: benighn, BS positve, no tenderness, no AAA no bruit.  No HSM or HJR Distal pulses intact with no bruits No edema Neuro non-focal Skin warm and dry Right THR    ASSESSMENT & PLAN:    AR/AS: significant progression to severe AS see above Will arrange right an dleft cath, cardiac CTA and CVTS referral I think she would be a good candidate for TAVR pending sizing and degree of CAD  HTN:  Well controlled.  Continue current medications and low sodium Dash type diet.    Thyroid:  On replacement TSH with primary in [place] dose recently decreased   ChoL  On statin Cholesterol is at goal.  Continue current dose of statin and diet Rx.  No myalgias or side effects.  F/U  Labs with primary    Medication Adjustments/Labs and Tests Ordered: Current medicines are reviewed at length with the patient today.  Concerns regarding medicines are outlined above.   Tests Ordered: No orders of the defined types were placed in this encounter.  Pre Cath / CTA labs Right / Left cath Cardiac CTA TAVR protocol Refer to Structural team and CVTS  Medication Changes: No orders of the defined types were placed in this encounter.    Disposition:  Follow up likely post TAVR   Signed, Charlton Haws, MD  02/16/2023 6:55 PM    [facility]

## 2023-02-16 NOTE — Progress Notes (Signed)
Date:  02/16/2023   ID:  Holly Patrick, DOB [birth year], MRN [id number]  PCP:  Lonie Peak, PA-C  Cardiologist:  Eden Emms Electrophysiologist:  None   Evaluation Performed:  Follow-Up Visit  Chief Complaint:  Aortic Valve Disease   History of Present Illness:    77 y.o. followed since 01-Mar-2018 for aortic stenosis . 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 Husband died in 03-01-2020 They were married for 8 years Son and daughter helping with farm   TTE 11/24/20 showed some progression but still in moderate range Mean gradient 27 peak 47 mmHg DVI 0.31 and AVA 1.1 cm2 TTE 11/16/21 EF 60-65% mean gradient 34 peak 58 DVI 0.29 and AVA 1 cm2  Had right THR with Dr Ophelia Charter recovered well   LDL 79    TTE done 05/31/22 with no change in gradients normal EF severe MAC mild MR  Mean 33 peak 57.8 DVI 0.31 AVA 0.90  TTE 02/14/23 mean gradient 52 mmHg peak 85 mmHg DVI 0.22 AVA 0.69 cm2  Significant progression AS. Discussed right and left cath Cardiac CTA and referral for TAVR.   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 Has had more dyspnea and fatigue but despite jump in  Gradient her functional capacity is still quite good   Long discussion with her two children and patient about w/u for TAVR to include right/left cath this week CTA next week and consults with CVTS.   Shared Decision Making/Informed Consent The risks [stroke (1 in 1000), death (1 in 1000), kidney failure [usually temporary] (1 in 500), bleeding (1 in 200), allergic reaction [possibly serious] (1 in 200)], benefits (diagnostic support and management of coronary artery disease) and alternatives of a cardiac catheterization were discussed in detail with Holly Patrick and she is willing to proceed.     Past Medical History:  Diagnosis Date   Arthritis    right hip   Hypertension    Hypothyroidism    Osteoporosis    Sleep apnea    before weight loss- does not wear cpap anymore after losing 40 lbs   Past Surgical  History:  Procedure Laterality Date   BREAST BIOPSY Bilateral    PARTIAL HYSTERECTOMY  1980's   TOTAL HIP ARTHROPLASTY Right 11/28/2020   Procedure: RIGHT TOTAL HIP ARTHROPLASTY ANTERIOR APPROACH;  Surgeon: Eldred Manges, MD;  Location: [facility];  Service: Orthopedics;  Laterality: Right;   TUBAL LIGATION       No outpatient medications have been marked as taking for the 02/21/23 encounter (Appointment) with Wendall Stade, MD.     Allergies:   Patient has no known allergies.   Social History   Tobacco Use   Smoking status: Never   Smokeless tobacco: Never  Vaping Use   Vaping status: Never Used  Substance Use Topics   Alcohol use: No   Drug use: No     Family Hx: The patient's family history includes Alzheimer's disease in her mother; Stroke in her father.  ROS:   Please see the history of present illness.     All other systems reviewed and are negative.   Prior CV studies:   The following studies were reviewed today:  TTE  02/14/23 AV Area (Vmax):    0.66 cm  AV Area (Vmean):   0.63 cm  AV Area (VTI):     0.69 cm  AV Vmax:           461.00 cm/s  AV  Vmean:          316.500 cm/s  AV VTI:            1.066 m  AV Peak Grad:      85.0 mmHg  AV Mean Grad:      52.0 mmHg  LVOT Vmax:         96.70 cm/s  LVOT Vmean:        63.000 cm/s  LVOT VTI:          0.235 m  LVOT/AV VTI ratio: 0.22   Labs/Other Tests and Data Reviewed:    EKG:  SR rate 68 normal no LVH 11/21/17  Recent Labs: No results found for requested labs within last 365 days.   Recent Lipid Panel No results found for: "CHOL", "TRIG", "HDL", "CHOLHDL", "LDLCALC", "LDLDIRECT"  Wt Readings from Last 3 Encounters:  05/31/22 152 lb (68.9 kg)  02/20/21 145 lb 6.4 oz (66 kg)  01/07/21 150 lb (68 kg)     Objective:    Vital Signs:  There were no vitals taken for this visit.   Affect appropriate Healthy:  appears stated age HEENT: normal Neck supple with no adenopathy JVP normal no bruits no  thyromegaly Lungs clear with no wheezing and good diaphragmatic motion Heart:  S1/S2 muffled late peaking AS murmur , no rub, gallop or click PMI normal Abdomen: benighn, BS positve, no tenderness, no AAA no bruit.  No HSM or HJR Distal pulses intact with no bruits No edema Neuro non-focal Skin warm and dry Right THR    ASSESSMENT & PLAN:    AR/AS: significant progression to severe AS see above Will arrange right an dleft cath, cardiac CTA and CVTS referral I think she would be a good candidate for TAVR pending sizing and degree of CAD  HTN:  Well controlled.  Continue current medications and low sodium Dash type diet.    Thyroid:  On replacement TSH with primary in [place] dose recently decreased   ChoL  On statin Cholesterol is at goal.  Continue current dose of statin and diet Rx.  No myalgias or side effects.  F/U  Labs with primary    Medication Adjustments/Labs and Tests Ordered: Current medicines are reviewed at length with the patient today.  Concerns regarding medicines are outlined above.   Tests Ordered: No orders of the defined types were placed in this encounter.  Pre Cath / CTA labs Right / Left cath Cardiac CTA TAVR protocol Refer to Structural team and CVTS  Medication Changes: No orders of the defined types were placed in this encounter.    Disposition:  Follow up likely post TAVR   Signed, Charlton Haws, MD  02/16/2023 6:55 PM    [facility]

## 2023-02-21 ENCOUNTER — Encounter: Payer: Self-pay | Admitting: Cardiovascular Disease

## 2023-02-21 ENCOUNTER — Ambulatory Visit: Payer: Medicare Other | Attending: Cardiovascular Disease | Admitting: Cardiovascular Disease

## 2023-02-21 ENCOUNTER — Other Ambulatory Visit: Payer: Self-pay | Admitting: Cardiovascular Disease

## 2023-02-21 VITALS — BP 136/64 | HR 61 | Ht 63.0 in | Wt 159.6 lb

## 2023-02-21 DIAGNOSIS — E782 Mixed hyperlipidemia: Secondary | ICD-10-CM | POA: Diagnosis not present

## 2023-02-21 DIAGNOSIS — I350 Nonrheumatic aortic (valve) stenosis: Secondary | ICD-10-CM | POA: Diagnosis not present

## 2023-02-21 DIAGNOSIS — I10 Essential (primary) hypertension: Secondary | ICD-10-CM

## 2023-02-21 NOTE — Patient Instructions (Addendum)
Medication Instructions:  Your physician recommends that you continue on your current medications as directed. Please refer to the Current Medication list given to you today.  *If you need a refill on your cardiac medications before your next appointment, please call your pharmacy*  Lab Work: Your physician recommends that you get lab work done today- BMET and CBC  If you have labs (blood work) drawn today and your tests are completely normal, you will receive your results only by: MyChart Message (if you have MyChart) OR A paper copy in the mail If you have any lab test that is abnormal or we need to change your treatment, we will call you to review the results.  Testing/Procedures: Your physician has requested that you have a cardiac catheterization. Cardiac catheterization is used to diagnose and/or treat various heart conditions. Doctors may recommend this procedure for a number of different reasons. The most common reason is to evaluate chest pain. Chest pain can be a symptom of coronary artery disease (CAD), and cardiac catheterization can show whether plaque is narrowing or blocking your heart's arteries. This procedure is also used to evaluate the valves, as well as measure the blood flow and oxygen levels in different parts of your heart. For further information please visit [email or web address] Please follow instruction sheet, as given.  Cardiac CT scanning for TAVR, (CAT scanning), is a noninvasive, special x-ray that produces cross-sectional images of the body using x-rays and a computer. CT scans help physicians diagnose and treat medical conditions. For some CT exams, a contrast material is used to enhance visibility in the area of the body being studied. CT scans provide greater clarity and reveal more details than regular x-ray exams.  Follow-Up: At [facility], you and your health needs are our priority.  As part of our continuing mission to provide you with exceptional  heart care, we have created designated Provider Care Teams.  These Care Teams include your primary Cardiologist (physician) and Advanced Practice Providers (APPs -  Physician Assistants and Nurse Practitioners) who all work together to provide you with the care you need, when you need it.  We recommend signing up for the patient portal called "MyChart".  Sign up information is provided on this After Visit Summary.  MyChart is used to connect with patients for Virtual Visits (Telemedicine).  Patients are able to view lab/test results, encounter notes, upcoming appointments, etc.  Non-urgent messages can be sent to your provider as well.   To learn more about what you can do with MyChart, go to [email or web address].    Your next appointment:   Someone will call you to schedule you with the structural heart team.  You have been referred to Dr. Laneta Simmers with Cardiothoracic surgery. Other Instructions  [facility] A DEPT OF [place]. [facility] AT [facility] [address] [place], [place] [address] [place] [address] Dept: [phone] Loc: [phone]  Holly Patrick  02/21/2023  You are scheduled for a Cardiac Catheterization on Friday, August 9 with Dr. Verne Carrow.  1. Please arrive at the [facility] (Main Entrance A) at [facility]: [address] [place], [place] [address] at 5:30 AM (This time is 2 hour(s) before your procedure to ensure your preparation). Free valet parking service is available. You will check in at ADMITTING. The support person will be asked to wait in the waiting room.  It is OK to have someone drop you off and come back when you are  ready to be discharged.    Special note: Every effort is made to have your procedure done on time. Please understand that emergencies sometimes delay scheduled procedures.  2. Diet: Do not eat solid foods after midnight.  The patient may have clear liquids until 5am upon the day of  the procedure.  3. Labs: You will need to have blood drawn on , August 5 at [facility] at [facility]. [address], [place]  Open: 7:30am - 5pm    Phone: [phone]. You do not need to be fasting.  4. Medication instructions in preparation for your procedure:   Contrast Allergy: No  Stop taking, Benazepril (Lotensin) Friday, August 9,   On the morning of your procedure, take your Aspirin 81 mg and any morning medicines NOT listed above.  You may use sips of water.  5. Plan to go home the same day, you will only stay overnight if medically necessary. 6. Bring a current list of your medications and current insurance cards. 7. You MUST have a responsible person to drive you home. 8. Someone MUST be with you the first 24 hours after you arrive home or your discharge will be delayed. 9. Please wear clothes that are easy to get on and off and wear slip-on shoes.  Thank you for allowing [place] to care for you!   -- Holly Patrick Invasive Cardiovascular services

## 2023-02-23 ENCOUNTER — Telehealth: Payer: Self-pay | Admitting: *Deleted

## 2023-02-23 NOTE — Telephone Encounter (Signed)
Cardiac Catheterization scheduled at [facility] for: Friday February 25, 2023 7:30 AM Arrival time [facility] Main Entrance A at: 5:30 AM  Nothing to eat after midnight prior to procedure, clear liquids until 5 AM day of procedure.  Medication instructions: -Usual morning medications can be taken with sips of water including aspirin 81 mg.  Plan to go home the same day, you will only stay overnight if medically necessary.  You must have responsible adult to drive you home.  Someone must be with you the first 24 hours after you arrive home.  Reviewed procedure instructions with patient

## 2023-02-25 ENCOUNTER — Ambulatory Visit (HOSPITAL_COMMUNITY)
Admission: RE | Admit: 2023-02-25 | Discharge: 2023-02-25 | Disposition: A | Discharge status: Home / Self Care | Payer: Medicare Other | Attending: Cardiovascular Disease | Admitting: Cardiovascular Disease

## 2023-02-25 ENCOUNTER — Encounter (HOSPITAL_COMMUNITY)
Admission: RE | Disposition: A | Discharge status: Home / Self Care | Payer: Self-pay | Source: Home / Self Care | Attending: Cardiovascular Disease

## 2023-02-25 ENCOUNTER — Other Ambulatory Visit: Payer: Self-pay

## 2023-02-25 DIAGNOSIS — I2510 Atherosclerotic heart disease of native coronary artery without angina pectoris: Secondary | ICD-10-CM | POA: Diagnosis not present

## 2023-02-25 DIAGNOSIS — E039 Hypothyroidism, unspecified: Secondary | ICD-10-CM | POA: Diagnosis not present

## 2023-02-25 DIAGNOSIS — Z79899 Other long term (current) drug therapy: Secondary | ICD-10-CM | POA: Diagnosis not present

## 2023-02-25 DIAGNOSIS — I350 Nonrheumatic aortic (valve) stenosis: Secondary | ICD-10-CM | POA: Diagnosis not present

## 2023-02-25 DIAGNOSIS — E785 Hyperlipidemia, unspecified: Secondary | ICD-10-CM | POA: Diagnosis not present

## 2023-02-25 DIAGNOSIS — Z79890 Hormone replacement therapy (postmenopausal): Secondary | ICD-10-CM | POA: Diagnosis not present

## 2023-02-25 DIAGNOSIS — I10 Essential (primary) hypertension: Secondary | ICD-10-CM | POA: Insufficient documentation

## 2023-02-25 HISTORY — PX: RIGHT/LEFT HEART CATH AND CORONARY ANGIOGRAPHY: CATH118266

## 2023-02-25 LAB — POCT I-STAT EG7
Acid-base deficit: 2 mmol/L (ref 0.0–2.0)
Bicarbonate: 23.6 mmol/L (ref 20.0–28.0)
Calcium, Ion: 1.23 mmol/L (ref 1.15–1.40)
HCT: 36 % (ref 36.0–46.0)
Hemoglobin: 12.2 g/dL (ref 12.0–15.0)
O2 Saturation: 67 %
Potassium: 4 mmol/L (ref 3.5–5.1)
Sodium: 139 mmol/L (ref 135–145)
TCO2: 25 mmol/L (ref 22–32)
pCO2, Ven: 41.5 mmHg — ABNORMAL LOW (ref 44–60)
pH, Ven: 7.363 (ref 7.25–7.43)
pO2, Ven: 36 mmHg (ref 32–45)

## 2023-02-25 LAB — POCT I-STAT 7, (LYTES, BLD GAS, ICA,H+H)
Acid-base deficit: 2 mmol/L (ref 0.0–2.0)
Bicarbonate: 22.3 mmol/L (ref 20.0–28.0)
Calcium, Ion: 1.12 mmol/L — ABNORMAL LOW (ref 1.15–1.40)
HCT: 38 % (ref 36.0–46.0)
Hemoglobin: 12.9 g/dL (ref 12.0–15.0)
O2 Saturation: 96 %
Potassium: 3.8 mmol/L (ref 3.5–5.1)
Sodium: 142 mmol/L (ref 135–145)
TCO2: 23 mmol/L (ref 22–32)
pCO2 arterial: 36.3 mmHg (ref 32–48)
pH, Arterial: 7.397 (ref 7.35–7.45)
pO2, Arterial: 81 mmHg — ABNORMAL LOW (ref 83–108)

## 2023-02-25 SURGERY — RIGHT/LEFT HEART CATH AND CORONARY ANGIOGRAPHY
Anesthesia: LOCAL

## 2023-02-25 MED ORDER — SODIUM CHLORIDE 0.9% FLUSH: 3.0000 mL | Freq: Two times a day (BID) | INTRAVENOUS | Status: DC

## 2023-02-25 MED ORDER — SODIUM CHLORIDE 0.9 % IV SOLN: INTRAVENOUS | Status: AC

## 2023-02-25 MED ORDER — LIDOCAINE HCL (PF) 1 % IJ SOLN
INTRAMUSCULAR | Status: DC | PRN
  Administered 2023-02-25 (×2): 5 mL

## 2023-02-25 MED ORDER — ONDANSETRON HCL 4 MG/2ML IJ SOLN: 4.0000 mg | Freq: Four times a day (QID) | INTRAMUSCULAR | Status: DC | PRN

## 2023-02-25 MED ORDER — HYDRALAZINE HCL 20 MG/ML IJ SOLN: 10.0000 mg | INTRAMUSCULAR | Status: DC | PRN

## 2023-02-25 MED ORDER — ASPIRIN 81 MG PO CHEW: 81.0000 mg | CHEWABLE_TABLET | ORAL | Status: DC

## 2023-02-25 MED ORDER — MIDAZOLAM HCL 2 MG/2ML IJ SOLN
INTRAMUSCULAR | Status: AC
  Filled 2023-02-25: qty 2

## 2023-02-25 MED ORDER — SODIUM CHLORIDE 0.9% FLUSH: 3.0000 mL | INTRAVENOUS | Status: DC | PRN

## 2023-02-25 MED ORDER — FENTANYL CITRATE (PF) 100 MCG/2ML IJ SOLN
INTRAMUSCULAR | Status: DC | PRN
  Administered 2023-02-25: 25 ug via INTRAVENOUS

## 2023-02-25 MED ORDER — HEPARIN SODIUM (PORCINE) 1000 UNIT/ML IJ SOLN
INTRAMUSCULAR | Status: AC
  Filled 2023-02-25: qty 10

## 2023-02-25 MED ORDER — SODIUM CHLORIDE 0.9 % IV SOLN: 250.0000 mL | INTRAVENOUS | Status: DC | PRN

## 2023-02-25 MED ORDER — VERAPAMIL HCL 2.5 MG/ML IV SOLN
INTRAVENOUS | Status: AC
  Filled 2023-02-25: qty 2

## 2023-02-25 MED ORDER — LIDOCAINE HCL (PF) 1 % IJ SOLN
INTRAMUSCULAR | Status: AC
  Filled 2023-02-25: qty 30

## 2023-02-25 MED ORDER — IOHEXOL 350 MG/ML SOLN
INTRAVENOUS | Status: DC | PRN
  Administered 2023-02-25: 30 mL

## 2023-02-25 MED ORDER — VERAPAMIL HCL 2.5 MG/ML IV SOLN
INTRAVENOUS | Status: DC | PRN
  Administered 2023-02-25: 10 mL via INTRA_ARTERIAL

## 2023-02-25 MED ORDER — HEPARIN SODIUM (PORCINE) 1000 UNIT/ML IJ SOLN
INTRAMUSCULAR | Status: DC | PRN
  Administered 2023-02-25: 3500 [IU] via INTRAVENOUS

## 2023-02-25 MED ORDER — HEPARIN (PORCINE) IN NACL 1000-0.9 UT/500ML-% IV SOLN
INTRAVENOUS | Status: DC | PRN
  Administered 2023-02-25 (×2): 500 mL

## 2023-02-25 MED ORDER — MIDAZOLAM HCL 2 MG/2ML IJ SOLN
INTRAMUSCULAR | Status: DC | PRN
  Administered 2023-02-25: 1 mg via INTRAVENOUS

## 2023-02-25 MED ORDER — ACETAMINOPHEN 325 MG PO TABS: 650.0000 mg | ORAL_TABLET | ORAL | Status: DC | PRN

## 2023-02-25 MED ORDER — LABETALOL HCL 5 MG/ML IV SOLN: 10.0000 mg | INTRAVENOUS | Status: DC | PRN

## 2023-02-25 MED ORDER — SODIUM CHLORIDE 0.9 % IV SOLN: INTRAVENOUS | Status: DC

## 2023-02-25 MED ORDER — FENTANYL CITRATE (PF) 100 MCG/2ML IJ SOLN
INTRAMUSCULAR | Status: AC
  Filled 2023-02-25: qty 2

## 2023-02-25 SURGICAL SUPPLY — 13 items
CATH 5FR JL3.5 JR4 ANG PIG MP (CATHETERS) IMPLANT
CATH BALLN WEDGE 5F 110CM (CATHETERS) IMPLANT
CATH LAUNCHER 5F EBU3.0 (CATHETERS) IMPLANT
CATHETER LAUNCHER 5F EBU3.0 (CATHETERS) ×1
DEVICE RAD COMP TR BAND LRG (VASCULAR PRODUCTS) IMPLANT
GLIDESHEATH SLEND SS 6F .021 (SHEATH) IMPLANT
GUIDEWIRE .025 260CM (WIRE) IMPLANT
GUIDEWIRE INQWIRE 1.5J.035X260 (WIRE) IMPLANT
INQWIRE 1.5J .035X260CM (WIRE) ×1
KIT SYRINGE INJ CVI SPIKEX1 (MISCELLANEOUS) IMPLANT
PACK CARDIAC CATHETERIZATION (CUSTOM PROCEDURE TRAY) ×1 IMPLANT
SET ATX-X65L (MISCELLANEOUS) IMPLANT
SHEATH GLIDE SLENDER 4/5FR (SHEATH) IMPLANT

## 2023-02-25 NOTE — Interval H&P Note (Signed)
History and Physical Interval Note:  02/25/2023 7:24 AM  Geralyn Flash  has presented today for surgery, with the diagnosis of aortic stenosis.  The various methods of treatment have been discussed with the patient and family. After consideration of risks, benefits and other options for treatment, the patient has consented to  Procedure(s): RIGHT/LEFT HEART CATH AND CORONARY ANGIOGRAPHY (N/A) as a surgical intervention.  The patient's history has been reviewed, patient examined, no change in status, stable for surgery.  I have reviewed the patient's chart and labs.  Questions were answered to the patient's satisfaction.    Cath Lab Visit (complete for each Cath Lab visit)  Clinical Evaluation Leading to the Procedure:   ACS: No.  Non-ACS:    Anginal Classification: No Symptoms  Anti-ischemic medical therapy: Minimal Therapy (1 class of medications)  Non-Invasive Test Results: No non-invasive testing performed  Prior CABG: No previous CABG        Verne Carrow

## 2023-02-28 ENCOUNTER — Encounter (HOSPITAL_COMMUNITY): Payer: Self-pay | Admitting: Cardiovascular Disease

## 2023-03-07 ENCOUNTER — Ambulatory Visit (HOSPITAL_COMMUNITY)
Admission: RE | Admit: 2023-03-07 | Discharge: 2023-03-07 | Disposition: A | Discharge status: Home / Self Care | Payer: Medicare Other | Source: Ambulatory Visit

## 2023-03-07 ENCOUNTER — Encounter: Payer: Self-pay | Admitting: Physician Assistant

## 2023-03-07 DIAGNOSIS — J439 Emphysema, unspecified: Secondary | ICD-10-CM | POA: Diagnosis not present

## 2023-03-07 DIAGNOSIS — I700 Atherosclerosis of aorta: Secondary | ICD-10-CM | POA: Diagnosis not present

## 2023-03-07 DIAGNOSIS — I2510 Atherosclerotic heart disease of native coronary artery without angina pectoris: Secondary | ICD-10-CM | POA: Diagnosis not present

## 2023-03-07 DIAGNOSIS — K551 Chronic vascular disorders of intestine: Secondary | ICD-10-CM | POA: Diagnosis not present

## 2023-03-07 DIAGNOSIS — I350 Nonrheumatic aortic (valve) stenosis: Secondary | ICD-10-CM | POA: Diagnosis not present

## 2023-03-07 DIAGNOSIS — K5730 Diverticulosis of large intestine without perforation or abscess without bleeding: Secondary | ICD-10-CM | POA: Diagnosis not present

## 2023-03-07 DIAGNOSIS — I10 Essential (primary) hypertension: Secondary | ICD-10-CM | POA: Insufficient documentation

## 2023-03-07 DIAGNOSIS — E782 Mixed hyperlipidemia: Secondary | ICD-10-CM | POA: Diagnosis not present

## 2023-03-07 DIAGNOSIS — K449 Diaphragmatic hernia without obstruction or gangrene: Secondary | ICD-10-CM | POA: Insufficient documentation

## 2023-03-07 MED ORDER — IOHEXOL 350 MG/ML SOLN
100.0000 mL | Freq: Once | INTRAVENOUS | Status: AC | PRN
  Administered 2023-03-07: 100 mL via INTRAVENOUS

## 2023-03-09 ENCOUNTER — Encounter: Payer: Self-pay | Admitting: Surgery

## 2023-03-09 ENCOUNTER — Institutional Professional Consult (permissible substitution): Payer: Medicare Other | Admitting: Surgery

## 2023-03-09 ENCOUNTER — Encounter: Payer: Self-pay | Admitting: *Deleted

## 2023-03-09 ENCOUNTER — Other Ambulatory Visit: Payer: Self-pay | Admitting: *Deleted

## 2023-03-09 VITALS — BP 167/83 | HR 80 | Resp 20 | Ht 62.0 in | Wt 156.0 lb

## 2023-03-09 DIAGNOSIS — I350 Nonrheumatic aortic (valve) stenosis: Secondary | ICD-10-CM

## 2023-03-09 NOTE — Progress Notes (Signed)
Procedure Type: Isolated AVR  PERIOPERATIVE OUTCOME ESTIMATE %  Operative Mortality 2.05%  Morbidity & Mortality 7.26%  Stroke 1.35%  Renal Failure 1.36%  Reoperation 3.16%  Prolonged Ventilation 3.77%  Deep Sternal Wound Infection 0.041%  Long Hospital Stay (>14 days) 3.15%  Short Hospital Stay (<6 days)* 44.7%

## 2023-03-09 NOTE — Progress Notes (Deleted)
Pre Surgical Assessment: 5 M Walk Test  18M=16.97ft  5 Meter Walk Test- trial 1: *** seconds 5 Meter Walk Test- trial 2: *** seconds 5 Meter Walk Test- trial 3: *** seconds 5 Meter Walk Test Average: *** seconds

## 2023-03-09 NOTE — Progress Notes (Signed)
Pre Surgical Assessment: 5 M Walk Test  65M=16.23ft  5 Meter Walk Test- trial 1: 5.91 seconds 5 Meter Walk Test- trial 2: 5.21 seconds 5 Meter Walk Test- trial 3: 6.40 seconds 5 Meter Walk Test Average: 5.84 seconds

## 2023-03-09 NOTE — Progress Notes (Signed)
Patient ID: Holly Patrick, female   DOB: July 20, 1945, 77 y.o.   MRN: [id number]  [facility] CLINIC       [address]       [name] [address]             [phone]          CARDIOTHORACIC SURGERY CONSULTATION REPORT  PCP is Lonie Peak, PA-C Referring Provider is Charlton Haws, MD Primary Cardiologist is Charlton Haws, MD  Reason for consultation: Severe aortic stenosis  HPI:  The patient is a 77 year old active woman with hypertension, hypothyroidism, OSA no longer on CPAP, and aortic stenosis that has been followed by Dr. Eden Emms.  An echocardiogram in May 2022 showed a mean gradient of 27 mmHg.  This increased to 34 mmHg on echo in May 2023.  Her most recent echo on 02/14/2023 showed a further increase in the mean gradient to 52 mmHg with a dimensionless index of 0.21.  The aortic valve is trileaflet with severe calcification and thickening and restricted leaflet mobility.  There is mild aortic insufficiency.  Aortic valve area was measured at 0.69 cm by VTI.  There is severe mitral annular calcification with trivial MR and no evidence of mitral stenosis.  Left ventricular ejection fraction was 60%.  She subsequently underwent cardiac catheterization on 02/25/2023 showing mild nonobstructive coronary disease.  The mean gradient was 53 mmHg with a peak to peak gradient of 58 mmHg and valve area of 0.69 cm.  Right heart pressures were normal.  She was referred for consideration of TAVR.  Her gated cardiac CTA showed a functionally bicuspid valve with fused right and left cusps and severe bulky calcification that extended down through the annulus at the base of the noncoronary cusp.  We discussed her case at our multidisciplinary heart valve meeting and there is concern about the risk of perivalvular leak and the further consequences in this active woman.  She reports that she feels fairly well overall.  She has noted  exertional fatigue and tiredness.  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  She has a son and daughter who are with her today and help her with the farm.  She has been able to continue doing some work.  She denies any shortness of breath but has to stop due to fatigue.  She denies any chest pain or pressure.  She has had no dizziness or syncope.  She does notice at the end of the day that there is a indentation from her socks suggesting lower extremity edema.  Past Medical History:  Diagnosis Date   Arthritis    right hip   Hypertension    Hypothyroidism    Osteoporosis    Severe aortic stenosis    Sleep apnea    before weight loss- does not wear cpap anymore after losing 40 lbs    Past Surgical History:  Procedure Laterality Date   BREAST BIOPSY Bilateral    PARTIAL HYSTERECTOMY  1980's   RIGHT/LEFT HEART CATH AND CORONARY ANGIOGRAPHY N/A 02/25/2023   Procedure: RIGHT/LEFT HEART CATH AND CORONARY ANGIOGRAPHY;  Surgeon: Kathleene Hazel, MD;  Location: [facility]AS[facility];  Service: Cardiovascular;  Laterality: N/A;   TOTAL HIP ARTHROPLASTY Right 11/28/2020   Procedure: RIGHT TOTAL HIP ARTHROPLASTY ANTERIOR APPROACH;  Surgeon: Eldred Manges, MD;  Location:  [facility] OR;  Service: Orthopedics;  Laterality: Right;   TUBAL LIGATION      Family History  Problem Relation Age of Onset   Alzheimer's disease Mother    Stroke Father     Social History   Socioeconomic History   Marital status: Unknown    Spouse name: Not on file   Number of children: Not on file   Years of education: Not on file   Highest education level: Not on file  Occupational History   Not on file  Tobacco Use   Smoking status: Never   Smokeless tobacco: Never  Vaping Use   Vaping status: Never Used  Substance and Sexual Activity   Alcohol use: No   Drug use: No   Sexual activity: Not on file  Other Topics Concern    Not on file  Social History Narrative   Not on file   Social Determinants of Health   Financial Resource Strain: Not on file  Food Insecurity: Not on file  Transportation Needs: Not on file  Physical Activity: Not on file  Stress: Not on file  Social Connections: Not on file  Intimate Partner Violence: Not on file    Prior to Admission medications   Medication Sig Start Date End Date Taking? Authorizing Provider  amLODipine (NORVASC) 10 MG tablet Take 10 mg by mouth daily.   Yes [provider]  atorvastatin (LIPITOR) 10 MG tablet Take 10 mg by mouth daily.   Yes [provider]  benazepril (LOTENSIN) 20 MG tablet Take 20 mg by mouth daily. 11/19/15  Yes [provider]  levothyroxine (SYNTHROID) 112 MCG tablet Take 112 mcg by mouth daily. 05/10/22  Yes [provider]    Current Outpatient Medications  Medication Sig Dispense Refill   amLODipine (NORVASC) 10 MG tablet Take 10 mg by mouth daily.     atorvastatin (LIPITOR) 10 MG tablet Take 10 mg by mouth daily.     benazepril (LOTENSIN) 20 MG tablet Take 20 mg by mouth daily.  2   levothyroxine (SYNTHROID) 112 MCG tablet Take 112 mcg by mouth daily.     No current facility-administered medications for this visit.    No Known Allergies    Review of Systems:   General:  normal appetite, + decreased energy, no weight gain, no weight loss, no fever  Cardiac:  no chest pain with exertion, no chest pain at rest, no SOB with exertion, no resting SOB, no PND, no orthopnea, no palpitations, no arrhythmia, no atrial fibrillation, + LE edema, no dizzy spells, no syncope  Respiratory:  no shortness of breath, no home oxygen, no productive cough, no dry cough, no bronchitis, no wheezing, no hemoptysis, no asthma, no pain with inspiration or cough, + sleep apnea, no longer uses CPAP at night after weight loss.  GI:   no difficulty swallowing, no reflux, no frequent heartburn, no hiatal hernia, no  abdominal pain, no constipation, no diarrhea, no hematochezia, no hematemesis, no melena  GU:   no dysuria,  no frequency, no urinary tract infection, no hematuria, no kidney stones, no kidney disease  Vascular:  no pain suggestive of claudication, no pain in feet, no leg cramps, no varicose veins, no DVT, no non-healing foot ulcer  Neuro:   no stroke, no TIA's, no seizures, no headaches, no temporary blindness one eye,  no slurred speech, no peripheral neuropathy, no chronic pain, no instability of gait, no memory/cognitive dysfunction  Musculoskeletal: + arthritis, no joint swelling, no myalgias,  no difficulty walking, normal mobility   Skin:   no rash, no itching, no skin infections, no pressure sores or ulcerations  Psych:   no anxiety, no depression, no nervousness, no unusual recent stress  Eyes:   no blurry vision, no floaters, no recent vision changes, no glasses or contacts  ENT:   no hearing loss, no loose or painful teeth, no dentures, last saw dentist 2023  Hematologic:  + easy bruising, no abnormal bleeding, no clotting disorder, no frequent epistaxis  Endocrine:  no diabetes, does not check CBG's at home     Physical Exam:   BP (!) 167/83   Pulse 80   Resp 20   Ht 5\' 2"  (1.575 m)   Wt 156 lb (70.8 kg)   SpO2 95% Comment: RA  BMI 28.53 kg/m   General:  Elderly but well-appearing  HEENT:  Unremarkable, NCAT, PERLA, EOMI  Neck:   no JVD, no bruits, no adenopathy   Chest:   clear to auscultation, symmetrical breath sounds, no wheezes, no rhonchi   CV:   RRR, 3/6 sytolic murmur RSB, no diastolic murmur  Abdomen:  soft, non-tender, no masses   Extremities:  warm, well-perfused, pedal pulses palpable, no lower extremity edema  Rectal/GU  Deferred  Neuro:   Grossly non-focal and symmetrical throughout  Skin:   Clean and dry, no rashes, no breakdown  Diagnostic Tests:  ECHOCARDIOGRAM REPORT       Patient Name:   Holly Patrick Date of Exam: 02/14/2023  Medical Rec #:   [id number]      Height:       63.0 in  Accession #:    [id number]     Weight:       152.0 lb  Date of Birth:  Nov 30, 1945      BSA:          1.721 m  Patient Age:    76 years       BP:           138/78 mmHg  Patient Gender: F              HR:           56 bpm.  Exam Location:  [address]   Procedure: 2D Echo, Color Doppler, Cardiac Doppler and 3D Echo   Indications:    Aortic stenosis I35.0    History:        Patient has prior history of Echocardiogram examinations,  most                 recent 05/31/2022. Risk Factors:Hypertension.    Sonographer:    Thurman Coyer RDCS  Referring Phys: [id number]   IMPRESSIONS     1. The aortic valve is tricuspid. There is severe calcifcation of the  aortic valve. There is severe thickening of the aortic valve. Aortic valve  regurgitation is mild. Severe aortic valve stenosis. Aortic valve area, by  VTI measures 0.69 cm. Aortic  valve mean gradient measures 52.0 mmHg. Aortic valve Vmax measures 4.61  m/s.   2. Left ventricular ejection fraction, by estimation, is 60 to 65%. Left  ventricular ejection fraction by 3D volume is 60 %. The left ventricle has  normal function. The left ventricle has no regional Levenhagen motion  abnormalities. There is mild left  ventricular hypertrophy. Left ventricular diastolic parameters are  indeterminate. The average left ventricular global longitudinal strain is  -21.3 %. The global longitudinal strain is normal.  3. Right ventricular systolic function is normal. The right ventricular  size is normal.   4. Left atrial size was moderately dilated.   5. The mitral valve is degenerative. Trivial mitral valve regurgitation.  No evidence of mitral stenosis. Severe mitral annular calcification.   6. The inferior vena cava is normal in size with greater than 50%  respiratory variability, suggesting right atrial pressure of 3 mmHg.   Comparison(s): Compared to prior TTE in 05/2022, the AS has progressed  to  severe (previously mod-to-severe).   FINDINGS   Left Ventricle: Left ventricular ejection fraction, by estimation, is 60  to 65%. Left ventricular ejection fraction by 3D volume is 60 %. The left  ventricle has normal function. The left ventricle has no regional Saleeby  motion abnormalities. The average  left ventricular global longitudinal strain is -21.3 %. The global  longitudinal strain is normal. The left ventricular internal cavity size  was normal in size. There is mild left ventricular hypertrophy. Left  ventricular diastolic parameters are  indeterminate.   Right Ventricle: The right ventricular size is normal. No increase in  right ventricular Weldin thickness. Right ventricular systolic function is  normal.   Left Atrium: Left atrial size was moderately dilated.   Right Atrium: Right atrial size was normal in size.   Pericardium: There is no evidence of pericardial effusion.   Mitral Valve: The mitral valve is degenerative in appearance. There is  mild thickening of the mitral valve leaflet(s). There is mild  calcification of the mitral valve leaflet(s). Severe mitral annular  calcification. Trivial mitral valve regurgitation. No   evidence of mitral valve stenosis.   Tricuspid Valve: The tricuspid valve is normal in structure. Tricuspid  valve regurgitation is trivial.   Aortic Valve: DI 0.21. The aortic valve is tricuspid. There is severe  calcifcation of the aortic valve. There is severe thickening of the aortic  valve. Aortic valve regurgitation is mild. Aortic regurgitation PHT  measures 454 msec. Severe aortic stenosis   is present. Aortic valve mean gradient measures 52.0 mmHg. Aortic valve  peak gradient measures 85.0 mmHg. Aortic valve area, by VTI measures 0.69  cm.   Pulmonic Valve: The pulmonic valve was normal in structure. Pulmonic valve  regurgitation is trivial.   Aorta: The aortic root and ascending aorta are structurally normal, with  no  evidence of dilitation.   Venous: The inferior vena cava is normal in size with greater than 50%  respiratory variability, suggesting right atrial pressure of 3 mmHg.   IAS/Shunts: The atrial septum is grossly normal.     LEFT VENTRICLE  PLAX 2D  LVIDd:         3.40 cm         Diastology  LVIDs:         2.00 cm         LV e' medial:    3.68 cm/s  LV PW:         1.10 cm         LV E/e' medial:  25.3  LV IVS:        1.20 cm         LV e' lateral:   4.56 cm/s  LVOT diam:     2.00 cm         LV E/e' lateral: 20.4  LV SV:         74  LV SV Index:   43  2D  LVOT Area:     3.14 cm        Longitudinal                                 Strain                                 2D Strain GLS  -21.3 %                                 Avg:                                   3D Volume EF                                 LV 3D EF:    Left                                              ventricul                                              ar                                              ejection                                              fraction                                              by 3D                                              volume is                                              60 %.                                   3D Volume EF:                                 3D EF:  60 %                                 LV EDV:       113 ml                                 LV ESV:       46 ml                                 LV SV:        68 ml   RIGHT VENTRICLE  RV Basal diam:  3.20 cm  RV Mid diam:    2.60 cm  RV S prime:     8.94 cm/s  TAPSE (M-mode): 2.1 cm   LEFT ATRIUM             Index        RIGHT ATRIUM           Index  LA diam:        4.40 cm 2.56 cm/m   RA Area:     14.40 cm  LA Vol (A2C):   79.6 ml 46.26 ml/m  RA Volume:   35.10 ml  20.40 ml/m  LA Vol (A4C):   34.4 ml 19.99 ml/m  LA Biplane Vol: 54.1 ml 31.44 ml/m   AORTIC VALVE  AV Area (Vmax):     0.66 cm  AV Area (Vmean):   0.63 cm  AV Area (VTI):     0.69 cm  AV Vmax:           461.00 cm/s  AV Vmean:          316.500 cm/s  AV VTI:            1.066 m  AV Peak Grad:      85.0 mmHg  AV Mean Grad:      52.0 mmHg  LVOT Vmax:         96.70 cm/s  LVOT Vmean:        63.000 cm/s  LVOT VTI:          0.235 m  LVOT/AV VTI ratio: 0.22  AI PHT:            454 msec    AORTA  Ao Root diam: 3.10 cm  Ao Asc diam:  3.40 cm   MITRAL VALVE  MV Area (PHT): 2.39 cm     SHUNTS  MV Decel Time: 317 msec     Systemic VTI:  0.24 m  MV E velocity: 93.00 cm/s   Systemic Diam: 2.00 cm  MV A velocity: 132.00 cm/s  MV E/A ratio:  0.70   Laurance Flatten MD  Electronically signed by Laurance Flatten MD  Signature Date/Time: 02/14/2023/1:34:23 PM        Final      hysicians  Panel Physicians Referring Physician Case Authorizing Physician  Kathleene Hazel, MD (Primary)     Procedures  RIGHT/LEFT HEART CATH AND CORONARY ANGIOGRAPHY   Conclusion      RPDA lesion is 30% stenosed.   Mid LAD lesion is 30% stenosed.   Mild non-obstructive CAD Severe aortic stenosis (mean gradient 53 mm Hg, peak to peak gradient 57.9 mm Hg, AVA 0.69 cm2) Normal right heart pressures (RA 5,  RV 25/2/6,  PA 23/10 mean 16, PCWP 5, CO 4.5 L/min)   Recommendations: Will continue planning for TAVR.      Indications  Severe aortic stenosis [I35.0 (ICD-10-CM)]   Procedural Details  Technical Details Indication: 77 yo female with history of severe aortic stenosis, workup for TAVR  Procedure: The risks, benefits, complications, treatment options, and expected outcomes were discussed with the patient. The patient and/or family concurred with the proposed plan, giving informed consent. The patient was sedated with Versed and Fentanyl. The IV catheter in the right antecubital vein was changed for a 5 [place] sheath. Right heart catheterization performed with a balloon tipped catheter. The right wrist was  prepped and draped in a sterile fashion. 1% lidocaine was used for local anesthesia. Using the modified Seldinger access technique, a 5 French sheath was placed in the right radial artery. 3 mg Verapamil was given through the sheath. Weight based IV heparin was given. Standard diagnostic catheters were used to perform selective coronary angiography. LV pressures measured with the JR4 catheter. No LV gram. All catheter exchanges were performed over an exchange length guidewire.   The sheath was removed from the right radial artery and a hemostasis band was applied at the arteriotomy site on the right wrist.      Estimated blood loss <50 mL.   During this procedure medications were administered to achieve and maintain moderate conscious sedation while the patient's heart rate, blood pressure, and oxygen saturation were continuously monitored and I was present face-to-face 100% of this time.   Medications (Filter: Administrations occurring from 0730 to 0827 on 02/25/23)  important  Continuous medications are totaled by the amount administered until 02/25/23 0827.   Heparin (Porcine) in NaCl 1000-0.9 UT/500ML-% SOLN (mL)  Total volume: 1,000 mL Date/Time Rate/Dose/Volume Action   02/25/23 0734 500 mL Given   0734 500 mL Given   fentaNYL (SUBLIMAZE) injection (mcg)  Total dose: 25 mcg Date/Time Rate/Dose/Volume Action   02/25/23 0742 25 mcg Given   midazolam (VERSED) injection (mg)  Total dose: 1 mg Date/Time Rate/Dose/Volume Action   02/25/23 0742 1 mg Given   lidocaine (PF) (XYLOCAINE) 1 % injection (mL)  Total volume: 10 mL Date/Time Rate/Dose/Volume Action   02/25/23 0751 5 mL Given   0753 5 mL Given   Radial Cocktail/Verapamil only (mL)  Total volume: 10 mL Date/Time Rate/Dose/Volume Action   02/25/23 0756 10 mL Given   heparin sodium (porcine) injection (Units)  Total dose: 3,500 Units Date/Time Rate/Dose/Volume Action   02/25/23 0804 3,500 Units Given   iohexol (OMNIPAQUE)  350 MG/ML injection (mL)  Total volume: 30 mL Date/Time Rate/Dose/Volume Action   02/25/23 0822 30 mL Given    Sedation Time  Sedation Time Physician-1: 32 minutes 8 seconds Contrast     Administrations occurring from 0730 to 0827 on 02/25/23:  Medication Name Total Dose  iohexol (OMNIPAQUE) 350 MG/ML injection 30 mL   Radiation/Fluoro  Fluoro time: 7.1 (min) DAP: 7891 (mGycm2) Cumulative Air Kerma: 122 (mGy) Complications  Complications documented before study signed (02/25/2023  8:28 AM)   No complications were associated with this study.  Documented by Lidia Collum, RCIS - 02/25/2023  7:34 AM     Coronary Findings  Diagnostic Dominance: Right Left Anterior Descending  Vessel is large.  Mid LAD lesion is 30% stenosed.    Left Circumflex  Vessel is large.    Right Coronary Artery  Vessel is large.    Right Posterior Descending Artery  Vessel is  moderate in size.  RPDA lesion is 30% stenosed.    Intervention   No interventions have been documented.   Coronary Diagrams  Diagnostic Dominance: Right  Intervention   Implants   No implant documentation for this case.   Syngo Images   Show images for CARDIAC CATHETERIZATION Images on Long Term Storage   Show images for Holly Patrick, Holly Patrick to Procedure Log  Procedure Log   Link to Procedure Log  Procedure Log    Hemo Data  Flowsheet Row Most Recent Value  Fick Cardiac Output 4.52 L/min  Fick Cardiac Output Index 2.57 (L/min)/BSA  Aortic Mean Gradient 53.02 mmHg  Aortic Peak Gradient 57.9 mmHg  Aortic Valve Area 0.69  Aortic Value Area Index 0.39 cm2/BSA  RA A Wave 7 mmHg  RA V Wave 5 mmHg  RA Mean 5 mmHg  RV Systolic Pressure 25 mmHg  RV Diastolic Pressure 2 mmHg  RV EDP 6 mmHg  PA Systolic Pressure 23 mmHg  PA Diastolic Pressure 10 mmHg  PA Mean 16 mmHg  PW A Wave 6 mmHg  PW V Wave 6 mmHg  PW Mean 5 mmHg  AO Systolic Pressure 120 mmHg  AO Diastolic Pressure 60 mmHg  AO  Mean 87 mmHg  LV Systolic Pressure 189 mmHg  LV Diastolic Pressure 5 mmHg  LV EDP 9 mmHg  AOp Systolic Pressure 127 mmHg  AOp Diastolic Pressure 60 mmHg  AOp Mean Pressure 91 mmHg  LVp Systolic Pressure 174 mmHg  LVp Diastolic Pressure 4 mmHg  LVp EDP Pressure 8 mmHg  QP/QS 1  TPVR Index 6.21 HRUI  TSVR Index 33.79 HRUI  PVR SVR Ratio 0.13  TPVR/TSVR Ratio 0.18   Narrative & Impression  CLINICAL DATA:  Aortic Stenosis   EXAM: Cardiac TAVR CT   TECHNIQUE: The patient was scanned on a Siemens Force 192 slice scanner. A 120 kV retrospective scan was triggered in the ascending thoracic aorta at 140 HU's. Gantry rotation speed was 250 msecs and collimation was .6 mm. No beta blockade or nitro were given. The 3D data set was reconstructed in 5% intervals of the R-R cycle. Systolic and diastolic phases were analyzed on a dedicated work station using MPR, MIP and VRT modes. The patient received 80 cc of contrast.   FINDINGS: Aortic Valve: Functionally bicuspid with fused right and left cusps. Calcified with restricted leaflet motion Score 2554   Aorta: No aneurysm moderate calcific atherosclerosis   Sino-tubular Junction: 25 mm   Ascending Thoracic Aorta: 32 mm   Aortic Arch: 24 mm   Descending Thoracic Aorta: 21 mm   Sinus of Valsalva Measurements:   Non-coronary: 31.1 mm  Height 21.5 mm   Right - coronary: 29.1 mm  Height 19 mm   Left -   coronary: 31.5 mm  Height 19 mm   Coronary Artery Height above Annulus:   Left Main: 11.2 mm above annulus   Right Coronary: 13.2 mm above annulus   Virtual Basal Annulus Measurements: Bulky calcium at base of non coronary cusp   Maximum / Minimum Diameter: 25.2 mm x 18 mm Average diameter 22.5 mm   Perimeter: 74.4 mm   Area: 403 mm2   Coronary Arteries: Sufficient height above annulus for deployment   Optimum Fluoroscopic Angle for Delivery: LAO 17 Caudal 9 degrees   Membranous septal length 5.2 mm    IMPRESSION: 1.  Calcified functionally bicuspid  AV with score 2554   2. Annular area of 403 mm 2 suitable for  23 mm Sapien 3 valve Alternatively can consider 26 mm Medtronic valve but large bulky calcium at base of non coronary cusp may increase risk of PVL   3. Optimum angiographic angle for deployment LAO 17 Caudal 9 degrees   4.  Coronary arteries sufficient height above annulus for deployment   5.  Membranous septal length 5.2 mm   Charlton Haws     Electronically Signed   By: Charlton Haws M.D.   On: 03/07/2023 10:54      Impression:  This 77 year old active woman has stage D, severe, symptomatic aortic stenosis with NYHA class II symptoms of exertional fatigue and tiredness consistent with chronic diastolic congestive heart failure.  She has a severely calcified functionally bicuspid aortic valve with bulky calcification extending down through the annulus beneath the noncoronary cusp.  I think this combination would increase her risk of paravalvular leak and suboptimal result with TAVR.  I think the best option for treating her would be open surgical aortic valve replacement to allow removal of her severely calcified bicuspid valve.  Cardiac catheterization shows mild nonobstructive coronary disease.  I discussed the alternatives of TAVR and open surgical aortic valve replacement with her and her son and daughter.  We discussed the benefits and risks of both.  She seems understand and agrees to proceed with open aortic valve replacement. I discussed the operative procedure with the patient and family including alternatives, benefits and risks; including but not limited to bleeding, blood transfusion, infection, stroke, myocardial infarction, graft failure, heart block requiring a permanent pacemaker, organ dysfunction, and death.  Holly Patrick understands and agrees to proceed.    Plan:  She will be scheduled for open surgical aortic valve replacement using a bioprosthetic valve  on Tuesday, 03/22/2023.  I spent 60 minutes performing this consultation and > 50% of this time was spent face to face counseling and coordinating the care of this patient's severe symptomatic aortic stenosis.   Alleen Borne, MD 03/09/2023 10:35 AM

## 2023-03-16 ENCOUNTER — Encounter: Payer: Self-pay | Admitting: Surgery

## 2023-03-17 NOTE — Pre-Procedure Instructions (Signed)
Surgical Instructions    Your procedure is scheduled on Tuesday, September 3rd.  Report to [facility] Main Entrance "A" at 5:30 A.M., then check in with the Admitting office.  Call this number if you have problems the morning of surgery:  [phone]  If you have any questions prior to your surgery date call [phone]: Open Monday-Friday 8am-4pm If you experience any cold or flu symptoms such as cough, fever, chills, shortness of breath, etc. between now and your scheduled surgery, please notify us at the above number.     Remember:  Do not eat or drink after midnight the night before your surgery    Take these medicines the morning of surgery with A SIP OF WATER: amLODipine (NORVASC)  atorvastatin (LIPITOR)  levothyroxine (SYNTHROID)   As of today, STOP taking any Aspirin (unless otherwise instructed by your surgeon) Aleve, Naproxen, Ibuprofen, Motrin, Advil, Goody's, BC's, all herbal medications, fish oil, and all vitamins.                     Do NOT Smoke (Tobacco/Vaping) for 24 hours prior to your procedure.  If you use a CPAP at night, you may bring your mask/headgear for your overnight stay.   Contacts, glasses, piercing's, hearing aid's, dentures or partials may not be worn into surgery, please bring cases for these belongings.    For patients admitted to the hospital, discharge time will be determined by your treatment team.   Patients discharged the day of surgery will not be allowed to drive home, and someone needs to stay with them for 24 hours.  SURGICAL WAITING ROOM VISITATION Patients having surgery or a procedure may have no more than 2 support people in the waiting area - these visitors may rotate.   Children under the age of 60 must have an adult with them who is not the patient. If the patient needs to stay at the hospital during part of their recovery, the visitor guidelines for inpatient rooms apply. Pre-op nurse will coordinate an appropriate time for 1  support person to accompany patient in pre-op.  This support person may not rotate.   Please refer to the [facility] website for the visitor guidelines for Inpatients (after your surgery is over and you are in a regular room).    Special instructions:   [place]- Preparing For Surgery  Before surgery, you can play an important role. Because skin is not sterile, your skin needs to be as free of germs as possible. You can reduce the number of germs on your skin by washing with CHG (chlorahexidine gluconate) Soap before surgery.  CHG is an antiseptic cleaner which kills germs and bonds with the skin to continue killing germs even after washing.    Oral Hygiene is also important to reduce your risk of infection.  Remember - BRUSH YOUR TEETH THE MORNING OF SURGERY WITH YOUR REGULAR TOOTHPASTE  Please do not use if you have an allergy to CHG or antibacterial soaps. If your skin becomes reddened/irritated stop using the CHG.  Do not shave (including legs and underarms) for at least 48 hours prior to first CHG shower. It is OK to shave your face.  Please follow these instructions carefully.   Shower the NIGHT BEFORE SURGERY and the MORNING OF SURGERY  If you chose to wash your hair, wash your hair first as usual with your normal shampoo.  After you shampoo, rinse your hair and body thoroughly to remove the shampoo.  Use CHG  Soap as you would any other liquid soap. You can apply CHG directly to the skin and wash gently with a scrungie or a clean washcloth.   Apply the CHG Soap to your body ONLY FROM THE NECK DOWN.  Do not use on open wounds or open sores. Avoid contact with your eyes, ears, mouth and genitals (private parts). Wash Face and genitals (private parts)  with your normal soap.   Wash thoroughly, paying special attention to the area where your surgery will be performed.  Thoroughly rinse your body with warm water from the neck down.  DO NOT shower/wash with your normal soap after  using and rinsing off the CHG Soap.  Pat yourself dry with a CLEAN TOWEL.  Wear CLEAN PAJAMAS to bed the night before surgery  Place CLEAN SHEETS on your bed the night before your surgery  DO NOT SLEEP WITH PETS.   Day of Surgery: Take a shower with CHG soap. Do not wear jewelry or makeup Do not wear lotions, powders, perfumes/colognes, or deodorant. Do not shave 48 hours prior to surgery.  Men may shave face and neck. Do not bring valuables to the hospital.  [facility] is not responsible for any belongings or valuables. Do not wear nail polish, gel polish, artificial nails, or any other type of covering on natural nails (fingers and toes) If you have artificial nails or gel coating that need to be removed by a nail salon, please have this removed prior to surgery. Artificial nails or gel coating may interfere with anesthesia's ability to adequately monitor your vital signs. Wear Clean/Comfortable clothing the morning of surgery Remember to brush your teeth WITH YOUR REGULAR TOOTHPASTE.   Please read over the following fact sheets that you were given.    If you received a COVID test during your pre-op visit  it is requested that you wear a mask when out in public, stay away from anyone that may not be feeling well and notify your surgeon if you develop symptoms. If you have been in contact with anyone that has tested positive in the last 10 days please notify you surgeon.

## 2023-03-18 ENCOUNTER — Ambulatory Visit (HOSPITAL_COMMUNITY)
Admission: RE | Admit: 2023-03-18 | Discharge: 2023-03-18 | Disposition: A | Discharge status: Home / Self Care | Payer: Medicare Other | Source: Ambulatory Visit | Attending: Surgery | Admitting: Surgery

## 2023-03-18 ENCOUNTER — Other Ambulatory Visit: Payer: Self-pay

## 2023-03-18 ENCOUNTER — Encounter (HOSPITAL_COMMUNITY)
Admission: RE | Admit: 2023-03-18 | Discharge: 2023-03-18 | Disposition: A | Discharge status: Home / Self Care | Payer: Medicare Other | Source: Ambulatory Visit | Attending: Surgery | Admitting: Surgery

## 2023-03-18 ENCOUNTER — Encounter (HOSPITAL_COMMUNITY): Payer: Self-pay

## 2023-03-18 VITALS — BP 155/67 | HR 66 | Temp 98.0°F | Resp 17 | Ht 63.0 in | Wt 156.9 lb

## 2023-03-18 DIAGNOSIS — I350 Nonrheumatic aortic (valve) stenosis: Secondary | ICD-10-CM | POA: Insufficient documentation

## 2023-03-18 DIAGNOSIS — Z01818 Encounter for other preprocedural examination: Secondary | ICD-10-CM | POA: Diagnosis not present

## 2023-03-18 DIAGNOSIS — I517 Cardiomegaly: Secondary | ICD-10-CM | POA: Diagnosis not present

## 2023-03-18 DIAGNOSIS — R600 Localized edema: Secondary | ICD-10-CM | POA: Diagnosis not present

## 2023-03-18 DIAGNOSIS — K449 Diaphragmatic hernia without obstruction or gangrene: Secondary | ICD-10-CM | POA: Diagnosis not present

## 2023-03-18 DIAGNOSIS — Z1152 Encounter for screening for COVID-19: Secondary | ICD-10-CM | POA: Insufficient documentation

## 2023-03-18 HISTORY — DX: Cardiac murmur, unspecified: R01.1

## 2023-03-18 HISTORY — DX: Pneumonia, unspecified organism: J18.9

## 2023-03-18 LAB — HEMOGLOBIN A1C
Hgb A1c MFr Bld: 5.2 % (ref 4.8–5.6)
Mean Plasma Glucose: 102.54 mg/dL

## 2023-03-18 LAB — URINALYSIS, ROUTINE W REFLEX MICROSCOPIC
Bacteria, UA: NONE SEEN
Bilirubin Urine: NEGATIVE
Glucose, UA: NEGATIVE mg/dL
Ketones, ur: NEGATIVE mg/dL
Leukocytes,Ua: NEGATIVE
Nitrite: NEGATIVE
Protein, ur: NEGATIVE mg/dL
Specific Gravity, Urine: 1.015 (ref 1.005–1.030)
pH: 6 (ref 5.0–8.0)

## 2023-03-18 LAB — CBC
HCT: 39.5 % (ref 36.0–46.0)
Hemoglobin: 13.2 g/dL (ref 12.0–15.0)
MCH: 30.8 pg (ref 26.0–34.0)
MCHC: 33.4 g/dL (ref 30.0–36.0)
MCV: 92.3 fL (ref 80.0–100.0)
Platelets: 282 10*3/uL (ref 150–400)
RBC: 4.28 MIL/uL (ref 3.87–5.11)
RDW: 13.3 % (ref 11.5–15.5)
WBC: 6.6 10*3/uL (ref 4.0–10.5)
nRBC: 0 % (ref 0.0–0.2)

## 2023-03-18 LAB — COMPREHENSIVE METABOLIC PANEL
ALT: 15 U/L (ref 0–44)
AST: 17 U/L (ref 15–41)
Albumin: 4.1 g/dL (ref 3.5–5.0)
Alkaline Phosphatase: 57 U/L (ref 38–126)
Anion gap: 11 (ref 5–15)
BUN: 12 mg/dL (ref 8–23)
CO2: 19 mmol/L — ABNORMAL LOW (ref 22–32)
Calcium: 9.1 mg/dL (ref 8.9–10.3)
Chloride: 107 mmol/L (ref 98–111)
Creatinine, Ser: 0.73 mg/dL (ref 0.44–1.00)
GFR, Estimated: >60 mL/min (ref >60)
Glucose, Bld: 97 mg/dL (ref 70–99)
Potassium: 4.2 mmol/L (ref 3.5–5.1)
Sodium: 137 mmol/L (ref 135–145)
Total Bilirubin: 0.9 mg/dL (ref 0.3–1.2)
Total Protein: 7.3 g/dL (ref 6.5–8.1)

## 2023-03-18 LAB — BLOOD GAS, ARTERIAL
Acid-base deficit: 1.7 mmol/L (ref 0.0–2.0)
Bicarbonate: 21.9 mmol/L (ref 20.0–28.0)
Drawn by: 58793
O2 Saturation: 98.7 %
Patient temperature: 37
pCO2 arterial: 33 mmHg (ref 32–48)
pH, Arterial: 7.43 (ref 7.35–7.45)
pO2, Arterial: 103 mmHg (ref 83–108)

## 2023-03-18 LAB — PROTIME-INR
INR: 0.9 (ref 0.8–1.2)
Prothrombin Time: 12 seconds (ref 11.4–15.2)

## 2023-03-18 LAB — SURGICAL PCR SCREEN
MRSA, PCR: NEGATIVE
Staphylococcus aureus: NEGATIVE

## 2023-03-18 LAB — SARS CORONAVIRUS 2 BY RT PCR: SARS Coronavirus 2 by RT PCR: NEGATIVE

## 2023-03-18 LAB — APTT: aPTT: 30 seconds (ref 24–36)

## 2023-03-18 MED ORDER — HEPARIN 30,000 UNITS/1000 ML (OHS) CELLSAVER SOLUTION
Status: DC
  Filled 2023-03-18: qty 1000

## 2023-03-18 MED ORDER — POTASSIUM CHLORIDE 2 MEQ/ML IV SOLN
80.0000 meq | INTRAVENOUS | Status: DC
  Filled 2023-03-18: qty 40

## 2023-03-18 MED ORDER — VANCOMYCIN HCL 1250 MG/250ML IV SOLN
1250.0000 mg | INTRAVENOUS | Status: AC
  Administered 2023-03-22: 1250 mg via INTRAVENOUS
  Filled 2023-03-18: qty 250

## 2023-03-18 MED ORDER — EPINEPHRINE HCL 5 MG/250ML IV SOLN IN NS
0.0000 ug/min | INTRAVENOUS | Status: DC
  Filled 2023-03-18: qty 250

## 2023-03-18 MED ORDER — NOREPINEPHRINE 4 MG/250ML-% IV SOLN
0.0000 ug/min | INTRAVENOUS | Status: DC
  Filled 2023-03-18: qty 250

## 2023-03-18 MED ORDER — NITROGLYCERIN IN D5W 200-5 MCG/ML-% IV SOLN
2.0000 ug/min | INTRAVENOUS | Status: DC
  Filled 2023-03-18: qty 250

## 2023-03-18 MED ORDER — TRANEXAMIC ACID (OHS) PUMP PRIME SOLUTION
2.0000 mg/kg | INTRAVENOUS | Status: DC
  Filled 2023-03-18: qty 1.42

## 2023-03-18 MED ORDER — PHENYLEPHRINE HCL-NACL 20-0.9 MG/250ML-% IV SOLN
30.0000 ug/min | INTRAVENOUS | Status: AC
  Administered 2023-03-22: 20 ug/min via INTRAVENOUS
  Filled 2023-03-18: qty 250

## 2023-03-18 MED ORDER — PLASMA-LYTE A IV SOLN
INTRAVENOUS | Status: DC
  Filled 2023-03-18: qty 2.5

## 2023-03-18 MED ORDER — TRANEXAMIC ACID (OHS) BOLUS VIA INFUSION
15.0000 mg/kg | INTRAVENOUS | Status: AC
  Administered 2023-03-22: 1068 mg via INTRAVENOUS
  Filled 2023-03-18: qty 1068

## 2023-03-18 MED ORDER — CEFAZOLIN SODIUM-DEXTROSE 2-4 GM/100ML-% IV SOLN
2.0000 g | INTRAVENOUS | Status: AC
  Administered 2023-03-22: 2 g via INTRAVENOUS
  Filled 2023-03-18: qty 100

## 2023-03-18 MED ORDER — TRANEXAMIC ACID 1000 MG/10ML IV SOLN
1.5000 mg/kg/h | INTRAVENOUS | Status: AC
  Administered 2023-03-22: 1.5 mg/kg/h via INTRAVENOUS
  Filled 2023-03-18: qty 25

## 2023-03-18 MED ORDER — MANNITOL 20 % IV SOLN
INTRAVENOUS | Status: DC
  Filled 2023-03-18: qty 13

## 2023-03-18 MED ORDER — DEXMEDETOMIDINE HCL IN NACL 400 MCG/100ML IV SOLN
0.1000 ug/kg/h | INTRAVENOUS | Status: AC
  Administered 2023-03-22: .3 ug/kg/h via INTRAVENOUS
  Filled 2023-03-18: qty 100

## 2023-03-18 MED ORDER — INSULIN REGULAR(HUMAN) IN NACL 100-0.9 UT/100ML-% IV SOLN
INTRAVENOUS | Status: AC
  Administered 2023-03-22: .8 [IU]/h via INTRAVENOUS
  Filled 2023-03-18: qty 100

## 2023-03-18 MED ORDER — MILRINONE LACTATE IN DEXTROSE 20-5 MG/100ML-% IV SOLN
0.3000 ug/kg/min | INTRAVENOUS | Status: DC
  Filled 2023-03-18: qty 100

## 2023-03-18 NOTE — Progress Notes (Signed)
PCP - Lonie Peak. PA-C Cardiologist - Dr. Charlton Haws - Last office visit 02/21/2023  PPM/ICD - Denies Device Orders - n/a Rep Notified - n/a  Chest x-ray - 03/18/2023 EKG - 02/21/2023 Stress Test - Denies ECHO - 02/14/2023 Cardiac Cath - 02/25/2023  Sleep Study - Pt was +OSA a few years ago, but lost 40 pounds and no longer qualified for OSA criteria and no longer wears CPAP.  No DM  Last dose of GLP1 agonist- n/a GLP1 instructions: n/a  Blood Thinner Instructions: n/a Aspirin Instructions: n/a   NPO after midnight   COVID TEST- Yes. Result Pending.   Anesthesia review: No.  Patient denies shortness of breath, fever, cough and chest pain at PAT appointment. Pt denies any respiratory illness/infection in the last two months.    All instructions explained to the patient, with a verbal understanding of the material. Patient agrees to go over the instructions while at home for a better understanding. Patient also instructed to self quarantine after being tested for COVID-19. The opportunity to ask questions was provided.

## 2023-03-21 NOTE — H&P (Signed)
[address]       [name] [address]             [phone]      Cardiothoracic Surgery Admission History and Physical   PCP is Lonie Peak, PA-C Referring Provider is Charlton Haws, MD Primary Cardiologist is Charlton Haws, MD   Reason for admission: Severe aortic stenosis   HPI:   The patient is a 77 year old active woman with hypertension, hypothyroidism, OSA no longer on CPAP, and aortic stenosis that has been followed by Dr. Eden Emms.  An echocardiogram in May 2022 showed a mean gradient of 27 mmHg.  This increased to 34 mmHg on echo in May 2023.  Her most recent echo on 02/14/2023 showed a further increase in the mean gradient to 52 mmHg with a dimensionless index of 0.21.  The aortic valve is trileaflet with severe calcification and thickening and restricted leaflet mobility.  There is mild aortic insufficiency.  Aortic valve area was measured at 0.69 cm by VTI.  There is severe mitral annular calcification with trivial MR and no evidence of mitral stenosis.  Left ventricular ejection fraction was 60%.  She subsequently underwent cardiac catheterization on 02/25/2023 showing mild nonobstructive coronary disease.  The mean gradient was 53 mmHg with a peak to peak gradient of 58 mmHg and valve area of 0.69 cm.  Right heart pressures were normal.  She was referred for consideration of TAVR.  Her gated cardiac CTA showed a functionally bicuspid valve with fused right and left cusps and severe bulky calcification that extended down through the annulus at the base of the noncoronary cusp.  We discussed her case at our multidisciplinary heart valve meeting and there is concern about the risk of perivalvular leak and the further consequences in this active woman.   She reports that she feels fairly well overall.  She has noted exertional fatigue and tiredness.  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  She has a son and daughter who are with her today and help her with the farm.  She has been able to continue doing some work.  She denies any shortness of breath but has to stop due to fatigue.  She denies any chest pain or pressure.  She has had no dizziness or syncope.  She does notice at the end of the day that there is a indentation from her socks suggesting lower extremity edema.       Past Medical History:  Diagnosis Date   Arthritis      right hip   Hypertension     Hypothyroidism     Osteoporosis     Severe aortic stenosis     Sleep apnea      before weight loss- does not wear cpap anymore after losing 40 lbs               Past Surgical History:  Procedure Laterality Date   BREAST BIOPSY Bilateral     PARTIAL HYSTERECTOMY   1980's   RIGHT/LEFT HEART CATH AND CORONARY ANGIOGRAPHY N/A 02/25/2023    Procedure: RIGHT/LEFT HEART CATH AND CORONARY ANGIOGRAPHY;  Surgeon: Kathleene Hazel, MD;  Location: [facility];  Service: Cardiovascular;  Laterality: N/A;   TOTAL HIP ARTHROPLASTY Right 11/28/2020    Procedure: RIGHT TOTAL HIP ARTHROPLASTY ANTERIOR APPROACH;  Surgeon: Eldred Manges, MD;  Location: [facility] OR;  Service: Orthopedics;  Laterality: Right;   TUBAL LIGATION                   Family History  Problem Relation Age of Onset   Alzheimer's disease Mother     Stroke Father            Social History         Socioeconomic History   Marital status: Unknown      Spouse name: Not on file   Number of children: Not on file   Years of education: Not on file   Highest education level: Not on file  Occupational History   Not on file  Tobacco Use   Smoking status: Never   Smokeless tobacco: Never  Vaping Use   Vaping status: Never Used  Substance and Sexual Activity   Alcohol use: No   Drug use: No   Sexual activity: Not on file  Other Topics Concern   Not on file  Social History Narrative   Not on file    Social Determinants  of Health    Financial Resource Strain: Not on file  Food Insecurity: Not on file  Transportation Needs: Not on file  Physical Activity: Not on file  Stress: Not on file  Social Connections: Not on file  Intimate Partner Violence: Not on file             Prior to Admission medications   Medication Sig Start Date End Date Taking? Authorizing Provider  amLODipine (NORVASC) 10 MG tablet Take 10 mg by mouth daily.     Yes [provider]  atorvastatin (LIPITOR) 10 MG tablet Take 10 mg by mouth daily.     Yes [provider]  benazepril (LOTENSIN) 20 MG tablet Take 20 mg by mouth daily. 11/19/15   Yes [provider]  levothyroxine (SYNTHROID) 112 MCG tablet Take 112 mcg by mouth daily. 05/10/22   Yes [provider]            Current Outpatient Medications  Medication Sig Dispense Refill   amLODipine (NORVASC) 10 MG tablet Take 10 mg by mouth daily.       atorvastatin (LIPITOR) 10 MG tablet Take 10 mg by mouth daily.       benazepril (LOTENSIN) 20 MG tablet Take 20 mg by mouth daily.   2   levothyroxine (SYNTHROID) 112 MCG tablet Take 112 mcg by mouth daily.          No current facility-administered medications for this visit.        Allergies  No Known Allergies         Review of Systems:               General:                      normal appetite, + decreased energy, no weight gain, no weight loss, no fever             Cardiac:                       no chest pain with exertion, no chest pain at rest, no SOB with exertion, no resting SOB, no PND, no orthopnea, no palpitations, no arrhythmia, no atrial fibrillation, + LE edema, no dizzy spells, no syncope             Respiratory:  no shortness of breath, no home oxygen, no productive cough, no dry cough, no bronchitis, no wheezing, no hemoptysis, no asthma, no pain with inspiration or cough, + sleep apnea, no longer uses CPAP at night after weight loss.             GI:                                no difficulty swallowing, no reflux, no frequent heartburn, no hiatal hernia, no abdominal pain, no constipation, no diarrhea, no hematochezia, no hematemesis, no melena             GU:                              no dysuria,  no frequency, no urinary tract infection, no hematuria, no kidney stones, no kidney disease             Vascular:                     no pain suggestive of claudication, no pain in feet, no leg cramps, no varicose veins, no DVT, no non-healing foot ulcer             Neuro:                         no stroke, no TIA's, no seizures, no headaches, no temporary blindness one eye,  no slurred speech, no peripheral neuropathy, no chronic pain, no instability of gait, no memory/cognitive dysfunction             Musculoskeletal:         + arthritis, no joint swelling, no myalgias, no difficulty walking, normal mobility              Skin:                            no rash, no itching, no skin infections, no pressure sores or ulcerations             Psych:                         no anxiety, no depression, no nervousness, no unusual recent stress             Eyes:                           no blurry vision, no floaters, no recent vision changes, no glasses or contacts             ENT:                            no hearing loss, no loose or painful teeth, no dentures, last saw dentist 2023             Hematologic:               + easy bruising, no abnormal bleeding, no clotting disorder, no frequent epistaxis             Endocrine:                   no diabetes, does not  check CBG's at home                            Physical Exam:               BP (!) 167/83   Pulse 80   Resp 20   Ht 5\' 2"  (1.575 m)   Wt 156 lb (70.8 kg)   SpO2 95% Comment: RA  BMI 28.53 kg/m              General:                      Elderly but well-appearing             HEENT:                       Unremarkable, NCAT, PERLA, EOMI             Neck:                           no JVD, no  bruits, no adenopathy              Chest:                          clear to auscultation, symmetrical breath sounds, no wheezes, no rhonchi              CV:                              RRR, 3/6 sytolic murmur RSB, no diastolic murmur             Abdomen:                    soft, non-tender, no masses              Extremities:                 warm, well-perfused, pedal pulses palpable, no lower extremity edema             Rectal/GU                   Deferred             Neuro:                         Grossly non-focal and symmetrical throughout             Skin:                            Clean and dry, no rashes, no breakdown   Diagnostic Tests:   ECHOCARDIOGRAM REPORT       Patient Name:   Holly Patrick Schleicher Date of Exam: 02/14/2023  Medical Rec #:  [id number]      Height:       63.0 in  Accession #:    [id number]     Weight:       152.0 lb  Date of Birth:  [birth year]      BSA:          1.721 m  Patient Age:    32 years  BP:           138/78 mmHg  Patient Gender: F              HR:           56 bpm.  Exam Location:  [address]   Procedure: 2D Echo, Color Doppler, Cardiac Doppler and 3D Echo   Indications:    Aortic stenosis I35.0    History:        Patient has prior history of Echocardiogram examinations,  most                 recent 05/31/2022. Risk Factors:Hypertension.    Sonographer:    Thurman Coyer RDCS  Referring Phys: [id number]   IMPRESSIONS     1. The aortic valve is tricuspid. There is severe calcifcation of the  aortic valve. There is severe thickening of the aortic valve. Aortic valve  regurgitation is mild. Severe aortic valve stenosis. Aortic valve area, by  VTI measures 0.69 cm. Aortic  valve mean gradient measures 52.0 mmHg. Aortic valve Vmax measures 4.61  m/s.   2. Left ventricular ejection fraction, by estimation, is 60 to 65%. Left  ventricular ejection fraction by 3D volume is 60 %. The left ventricle has  normal function. The left  ventricle has no regional Mcwright motion  abnormalities. There is mild left  ventricular hypertrophy. Left ventricular diastolic parameters are  indeterminate. The average left ventricular global longitudinal strain is  -21.3 %. The global longitudinal strain is normal.   3. Right ventricular systolic function is normal. The right ventricular  size is normal.   4. Left atrial size was moderately dilated.   5. The mitral valve is degenerative. Trivial mitral valve regurgitation.  No evidence of mitral stenosis. Severe mitral annular calcification.   6. The inferior vena cava is normal in size with greater than 50%  respiratory variability, suggesting right atrial pressure of 3 mmHg.   Comparison(s): Compared to prior TTE in 05/2022, the AS has progressed to  severe (previously mod-to-severe).   FINDINGS   Left Ventricle: Left ventricular ejection fraction, by estimation, is 60  to 65%. Left ventricular ejection fraction by 3D volume is 60 %. The left  ventricle has normal function. The left ventricle has no regional Wegener  motion abnormalities. The average  left ventricular global longitudinal strain is -21.3 %. The global  longitudinal strain is normal. The left ventricular internal cavity size  was normal in size. There is mild left ventricular hypertrophy. Left  ventricular diastolic parameters are  indeterminate.   Right Ventricle: The right ventricular size is normal. No increase in  right ventricular Desai thickness. Right ventricular systolic function is  normal.   Left Atrium: Left atrial size was moderately dilated.   Right Atrium: Right atrial size was normal in size.   Pericardium: There is no evidence of pericardial effusion.   Mitral Valve: The mitral valve is degenerative in appearance. There is  mild thickening of the mitral valve leaflet(s). There is mild  calcification of the mitral valve leaflet(s). Severe mitral annular  calcification. Trivial mitral valve  regurgitation. No   evidence of mitral valve stenosis.   Tricuspid Valve: The tricuspid valve is normal in structure. Tricuspid  valve regurgitation is trivial.   Aortic Valve: DI 0.21. The aortic valve is tricuspid. There is severe  calcifcation of the aortic valve. There is severe thickening of the aortic  valve. Aortic valve regurgitation is mild. Aortic regurgitation  PHT  measures 454 msec. Severe aortic stenosis   is present. Aortic valve mean gradient measures 52.0 mmHg. Aortic valve  peak gradient measures 85.0 mmHg. Aortic valve area, by VTI measures 0.69  cm.   Pulmonic Valve: The pulmonic valve was normal in structure. Pulmonic valve  regurgitation is trivial.   Aorta: The aortic root and ascending aorta are structurally normal, with  no evidence of dilitation.   Venous: The inferior vena cava is normal in size with greater than 50%  respiratory variability, suggesting right atrial pressure of 3 mmHg.   IAS/Shunts: The atrial septum is grossly normal.     LEFT VENTRICLE  PLAX 2D  LVIDd:         3.40 cm         Diastology  LVIDs:         2.00 cm         LV e' medial:    3.68 cm/s  LV PW:         1.10 cm         LV E/e' medial:  25.3  LV IVS:        1.20 cm         LV e' lateral:   4.56 cm/s  LVOT diam:     2.00 cm         LV E/e' lateral: 20.4  LV SV:         74  LV SV Index:   43              2D  LVOT Area:     3.14 cm        Longitudinal                                 Strain                                 2D Strain GLS  -21.3 %                                 Avg:                                   3D Volume EF                                 LV 3D EF:    Left                                              ventricul                                              ar  ejection                                              fraction                                              by 3D                                               volume is                                              60 %.                                   3D Volume EF:                                 3D EF:        60 %                                 LV EDV:       113 ml                                 LV ESV:       46 ml                                 LV SV:        68 ml   RIGHT VENTRICLE  RV Basal diam:  3.20 cm  RV Mid diam:    2.60 cm  RV S prime:     8.94 cm/s  TAPSE (M-mode): 2.1 cm   LEFT ATRIUM             Index        RIGHT ATRIUM           Index  LA diam:        4.40 cm 2.56 cm/m   RA Area:     14.40 cm  LA Vol (A2C):   79.6 ml 46.26 ml/m  RA Volume:   35.10 ml  20.40 ml/m  LA Vol (A4C):   34.4 ml 19.99 ml/m  LA Biplane Vol: 54.1 ml 31.44 ml/m   AORTIC VALVE  AV Area (Vmax):    0.66 cm  AV Area (Vmean):   0.63 cm  AV Area (VTI):     0.69 cm  AV Vmax:           461.00 cm/s  AV Vmean:          316.500 cm/s  AV VTI:  1.066 m  AV Peak Grad:      85.0 mmHg  AV Mean Grad:      52.0 mmHg  LVOT Vmax:         96.70 cm/s  LVOT Vmean:        63.000 cm/s  LVOT VTI:          0.235 m  LVOT/AV VTI ratio: 0.22  AI PHT:            454 msec    AORTA  Ao Root diam: 3.10 cm  Ao Asc diam:  3.40 cm   MITRAL VALVE  MV Area (PHT): 2.39 cm     SHUNTS  MV Decel Time: 317 msec     Systemic VTI:  0.24 m  MV E velocity: 93.00 cm/s   Systemic Diam: 2.00 cm  MV A velocity: 132.00 cm/s  MV E/A ratio:  0.70   Laurance Flatten MD  Electronically signed by Laurance Flatten MD  Signature Date/Time: 02/14/2023/1:34:23 PM        Final        hysicians   Panel Physicians Referring Physician Case Authorizing Physician  Kathleene Hazel, MD (Primary)        Procedures   RIGHT/LEFT HEART CATH AND CORONARY ANGIOGRAPHY    Conclusion       RPDA lesion is 30% stenosed.   Mid LAD lesion is 30% stenosed.   Mild non-obstructive CAD Severe aortic stenosis (mean gradient 53 mm Hg, peak to peak gradient 57.9 mm  Hg, AVA 0.69 cm2) Normal right heart pressures (RA 5, RV 25/2/6,  PA 23/10 mean 16, PCWP 5, CO 4.5 L/min)   Recommendations: Will continue planning for TAVR.      Indications   Severe aortic stenosis [I35.0 (ICD-10-CM)]    Procedural Details   Technical Details Indication: 77 yo female with history of severe aortic stenosis, workup for TAVR  Procedure: The risks, benefits, complications, treatment options, and expected outcomes were discussed with the patient. The patient and/or family concurred with the proposed plan, giving informed consent. The patient was sedated with Versed and Fentanyl. The IV catheter in the right antecubital vein was changed for a 5 [place] sheath. Right heart catheterization performed with a balloon tipped catheter. The right wrist was prepped and draped in a sterile fashion. 1% lidocaine was used for local anesthesia. Using the modified Seldinger access technique, a 5 French sheath was placed in the right radial artery. 3 mg Verapamil was given through the sheath. Weight based IV heparin was given. Standard diagnostic catheters were used to perform selective coronary angiography. LV pressures measured with the JR4 catheter. No LV gram. All catheter exchanges were performed over an exchange length guidewire.   The sheath was removed from the right radial artery and a hemostasis band was applied at the arteriotomy site on the right wrist.      Estimated blood loss <50 mL.   During this procedure medications were administered to achieve and maintain moderate conscious sedation while the patient's heart rate, blood pressure, and oxygen saturation were continuously monitored and I was present face-to-face 100% of this time.    Medications (Filter: Administrations occurring from 0730 to 0827 on 02/25/23)  important  Continuous medications are totaled by the amount administered until 02/25/23 0827.    Heparin (Porcine) in NaCl 1000-0.9 UT/500ML-% SOLN (mL)  Total  volume: 1,000 mL Date/Time Rate/Dose/Volume Action    02/25/23 0734 500 mL Given    0734 500 mL Given  fentaNYL (SUBLIMAZE) injection (mcg)  Total dose: 25 mcg Date/Time Rate/Dose/Volume Action    02/25/23 0742 25 mcg Given    midazolam (VERSED) injection (mg)  Total dose: 1 mg Date/Time Rate/Dose/Volume Action    02/25/23 0742 1 mg Given    lidocaine (PF) (XYLOCAINE) 1 % injection (mL)  Total volume: 10 mL Date/Time Rate/Dose/Volume Action    02/25/23 0751 5 mL Given    0753 5 mL Given    Radial Cocktail/Verapamil only (mL)  Total volume: 10 mL Date/Time Rate/Dose/Volume Action    02/25/23 0756 10 mL Given    heparin sodium (porcine) injection (Units)  Total dose: 3,500 Units Date/Time Rate/Dose/Volume Action    02/25/23 0804 3,500 Units Given    iohexol (OMNIPAQUE) 350 MG/ML injection (mL)  Total volume: 30 mL Date/Time Rate/Dose/Volume Action    02/25/23 0822 30 mL Given      Sedation Time   Sedation Time Physician-1: 32 minutes 8 seconds Contrast        Administrations occurring from 0730 to 0827 on 02/25/23:  Medication Name Total Dose  iohexol (OMNIPAQUE) 350 MG/ML injection 30 mL    Radiation/Fluoro   Fluoro time: 7.1 (min) DAP: 7891 (mGycm2) Cumulative Air Kerma: 122 (mGy) Complications   Complications documented before study signed (02/25/2023  8:28 AM)    No complications were associated with this study.  Documented by Lidia Collum, RCIS - 02/25/2023  7:34 AM      Coronary Findings   Diagnostic Dominance: Right Left Anterior Descending  Vessel is large.  Mid LAD lesion is 30% stenosed.    Left Circumflex  Vessel is large.    Right Coronary Artery  Vessel is large.    Right Posterior Descending Artery  Vessel is moderate in size.  RPDA lesion is 30% stenosed.    Intervention    No interventions have been documented.    Coronary Diagrams   Diagnostic Dominance: Right  Intervention    Implants    No implant documentation  for this case.    Syngo Images    Show images for CARDIAC CATHETERIZATION Images on Long Term Storage    Show images for Whitfill, JOLYNDA HELMS to Procedure Log   Procedure Log    Link to Procedure Log   Procedure Log    Hemo Data   Flowsheet Row Most Recent Value  Fick Cardiac Output 4.52 L/min  Fick Cardiac Output Index 2.57 (L/min)/BSA  Aortic Mean Gradient 53.02 mmHg  Aortic Peak Gradient 57.9 mmHg  Aortic Valve Area 0.69  Aortic Value Area Index 0.39 cm2/BSA  RA A Wave 7 mmHg  RA V Wave 5 mmHg  RA Mean 5 mmHg  RV Systolic Pressure 25 mmHg  RV Diastolic Pressure 2 mmHg  RV EDP 6 mmHg  PA Systolic Pressure 23 mmHg  PA Diastolic Pressure 10 mmHg  PA Mean 16 mmHg  PW A Wave 6 mmHg  PW V Wave 6 mmHg  PW Mean 5 mmHg  AO Systolic Pressure 120 mmHg  AO Diastolic Pressure 60 mmHg  AO Mean 87 mmHg  LV Systolic Pressure 189 mmHg  LV Diastolic Pressure 5 mmHg  LV EDP 9 mmHg  AOp Systolic Pressure 127 mmHg  AOp Diastolic Pressure 60 mmHg  AOp Mean Pressure 91 mmHg  LVp Systolic Pressure 174 mmHg  LVp Diastolic Pressure 4 mmHg  LVp EDP Pressure 8 mmHg  QP/QS 1  TPVR Index 6.21 HRUI  TSVR Index 33.79 HRUI  PVR SVR Ratio 0.13  TPVR/TSVR  Ratio 0.18    Narrative & Impression  CLINICAL DATA:  Aortic Stenosis   EXAM: Cardiac TAVR CT   TECHNIQUE: The patient was scanned on a Siemens Force 192 slice scanner. A 120 kV retrospective scan was triggered in the ascending thoracic aorta at 140 HU's. Gantry rotation speed was 250 msecs and collimation was .6 mm. No beta blockade or nitro were given. The 3D data set was reconstructed in 5% intervals of the R-R cycle. Systolic and diastolic phases were analyzed on a dedicated work station using MPR, MIP and VRT modes. The patient received 80 cc of contrast.   FINDINGS: Aortic Valve: Functionally bicuspid with fused right and left cusps. Calcified with restricted leaflet motion Score 2554   Aorta: No aneurysm moderate  calcific atherosclerosis   Sino-tubular Junction: 25 mm   Ascending Thoracic Aorta: 32 mm   Aortic Arch: 24 mm   Descending Thoracic Aorta: 21 mm   Sinus of Valsalva Measurements:   Non-coronary: 31.1 mm  Height 21.5 mm   Right - coronary: 29.1 mm  Height 19 mm   Left -   coronary: 31.5 mm  Height 19 mm   Coronary Artery Height above Annulus:   Left Main: 11.2 mm above annulus   Right Coronary: 13.2 mm above annulus   Virtual Basal Annulus Measurements: Bulky calcium at base of non coronary cusp   Maximum / Minimum Diameter: 25.2 mm x 18 mm Average diameter 22.5 mm   Perimeter: 74.4 mm   Area: 403 mm2   Coronary Arteries: Sufficient height above annulus for deployment   Optimum Fluoroscopic Angle for Delivery: LAO 17 Caudal 9 degrees   Membranous septal length 5.2 mm   IMPRESSION: 1.  Calcified functionally bicuspid  AV with score 2554   2. Annular area of 403 mm 2 suitable for 23 mm Sapien 3 valve Alternatively can consider 26 mm Medtronic valve but large bulky calcium at base of non coronary cusp may increase risk of PVL   3. Optimum angiographic angle for deployment LAO 17 Caudal 9 degrees   4.  Coronary arteries sufficient height above annulus for deployment   5.  Membranous septal length 5.2 mm   Charlton Haws     Electronically Signed   By: Charlton Haws M.D.   On: 03/07/2023 10:54        Impression:   This 77 year old active woman has stage D, severe, symptomatic aortic stenosis with NYHA class II symptoms of exertional fatigue and tiredness consistent with chronic diastolic congestive heart failure.  She has a severely calcified functionally bicuspid aortic valve with bulky calcification extending down through the annulus beneath the noncoronary cusp.  I think this combination would increase her risk of paravalvular leak and suboptimal result with TAVR.  I think the best option for treating her would be open surgical aortic valve replacement to  allow removal of her severely calcified bicuspid valve.  Cardiac catheterization shows mild nonobstructive coronary disease.  I discussed the alternatives of TAVR and open surgical aortic valve replacement with her and her son and daughter.  We discussed the benefits and risks of both.  She seems understand and agrees to proceed with open aortic valve replacement. I discussed the operative procedure with the patient and family including alternatives, benefits and risks; including but not limited to bleeding, blood transfusion, infection, stroke, myocardial infarction, graft failure, heart block requiring a permanent pacemaker, organ dysfunction, and death.  Holly Patrick understands and agrees to proceed.  Plan:   Open surgical aortic valve replacement using a bioprosthetic valve.     Alleen Borne, MD

## 2023-03-21 NOTE — Anesthesia Preprocedure Evaluation (Signed)
Anesthesia Evaluation  Patient identified by MRN, date of birth, ID band Patient awake    Reviewed: Allergy & Precautions, H&P , NPO status , Patient's Chart, lab work & pertinent test results  Airway Mallampati: II  TM Distance: >3 FB Neck ROM: Full    Dental no notable dental hx.    Pulmonary sleep apnea    Pulmonary exam normal breath sounds clear to auscultation       Cardiovascular hypertension, Normal cardiovascular exam+ Valvular Problems/Murmurs AS  Rhythm:Regular Rate:Normal  Severe AS. DI 0.21 Normal Biventricular function   Neuro/Psych negative neurological ROS  negative psych ROS   GI/Hepatic negative GI ROS, Neg liver ROS,,,  Endo/Other  Hypothyroidism    Renal/GU negative Renal ROS  negative genitourinary   Musculoskeletal  (+) Arthritis ,    Abdominal   Peds negative pediatric ROS (+)  Hematology negative hematology ROS (+)   Anesthesia Other Findings   Reproductive/Obstetrics negative OB ROS                              Anesthesia Physical Anesthesia Plan  ASA: 4  Anesthesia Plan: General   Post-op Pain Management:    Induction: Intravenous  PONV Risk Score and Plan: Ondansetron and Treatment may vary due to age or medical condition  Airway Management Planned: Oral ETT  Additional Equipment: Arterial line, CVP, PA Cath, 3D TEE and Ultrasound Guidance Line Placement  Intra-op Plan:   Post-operative Plan: Post-operative intubation/ventilation  Informed Consent: I have reviewed the patients History and Physical, chart, labs and discussed the procedure including the risks, benefits and alternatives for the proposed anesthesia with the patient or authorized representative who has indicated his/her understanding and acceptance.     Dental advisory given  Plan Discussed with: CRNA  Anesthesia Plan Comments: (The patient is a 77 year old active woman with  hypertension, hypothyroidism, OSA no longer on CPAP, and aortic stenosis)         Anesthesia Quick Evaluation

## 2023-03-22 ENCOUNTER — Encounter (HOSPITAL_COMMUNITY)
Admission: RE | Disposition: A | Discharge status: Home Health | Payer: Self-pay | Source: Home / Self Care | Attending: Surgery

## 2023-03-22 ENCOUNTER — Inpatient Hospital Stay (HOSPITAL_COMMUNITY): Payer: Medicare Other

## 2023-03-22 ENCOUNTER — Other Ambulatory Visit: Payer: Self-pay

## 2023-03-22 ENCOUNTER — Encounter (HOSPITAL_COMMUNITY): Payer: Self-pay | Admitting: Surgery

## 2023-03-22 ENCOUNTER — Inpatient Hospital Stay (HOSPITAL_COMMUNITY): Payer: Self-pay

## 2023-03-22 ENCOUNTER — Inpatient Hospital Stay (HOSPITAL_COMMUNITY)
Admission: RE | Admit: 2023-03-22 | Discharge: 2023-03-28 | DRG: 220 | Disposition: A | Discharge status: Home Health | Payer: Medicare Other | Attending: Surgery | Admitting: Surgery

## 2023-03-22 DIAGNOSIS — J9811 Atelectasis: Secondary | ICD-10-CM | POA: Diagnosis not present

## 2023-03-22 DIAGNOSIS — I350 Nonrheumatic aortic (valve) stenosis: Secondary | ICD-10-CM | POA: Diagnosis not present

## 2023-03-22 DIAGNOSIS — Z48813 Encounter for surgical aftercare following surgery on the respiratory system: Secondary | ICD-10-CM | POA: Diagnosis not present

## 2023-03-22 DIAGNOSIS — M810 Age-related osteoporosis without current pathological fracture: Secondary | ICD-10-CM | POA: Diagnosis not present

## 2023-03-22 DIAGNOSIS — Z79890 Hormone replacement therapy (postmenopausal): Secondary | ICD-10-CM

## 2023-03-22 DIAGNOSIS — I3481 Nonrheumatic mitral (valve) annulus calcification: Secondary | ICD-10-CM | POA: Diagnosis not present

## 2023-03-22 DIAGNOSIS — Z96641 Presence of right artificial hip joint: Secondary | ICD-10-CM | POA: Diagnosis present

## 2023-03-22 DIAGNOSIS — I700 Atherosclerosis of aorta: Secondary | ICD-10-CM | POA: Diagnosis not present

## 2023-03-22 DIAGNOSIS — Z823 Family history of stroke: Secondary | ICD-10-CM | POA: Diagnosis not present

## 2023-03-22 DIAGNOSIS — E039 Hypothyroidism, unspecified: Secondary | ICD-10-CM | POA: Diagnosis not present

## 2023-03-22 DIAGNOSIS — J939 Pneumothorax, unspecified: Secondary | ICD-10-CM | POA: Diagnosis not present

## 2023-03-22 DIAGNOSIS — R0989 Other specified symptoms and signs involving the circulatory and respiratory systems: Secondary | ICD-10-CM | POA: Diagnosis not present

## 2023-03-22 DIAGNOSIS — I2510 Atherosclerotic heart disease of native coronary artery without angina pectoris: Secondary | ICD-10-CM | POA: Diagnosis not present

## 2023-03-22 DIAGNOSIS — D72829 Elevated white blood cell count, unspecified: Secondary | ICD-10-CM | POA: Diagnosis not present

## 2023-03-22 DIAGNOSIS — I358 Other nonrheumatic aortic valve disorders: Secondary | ICD-10-CM | POA: Diagnosis not present

## 2023-03-22 DIAGNOSIS — Z79899 Other long term (current) drug therapy: Secondary | ICD-10-CM | POA: Diagnosis not present

## 2023-03-22 DIAGNOSIS — J918 Pleural effusion in other conditions classified elsewhere: Secondary | ICD-10-CM | POA: Diagnosis not present

## 2023-03-22 DIAGNOSIS — I10 Essential (primary) hypertension: Secondary | ICD-10-CM | POA: Diagnosis not present

## 2023-03-22 DIAGNOSIS — Z820 Family history of epilepsy and other diseases of the nervous system: Secondary | ICD-10-CM

## 2023-03-22 DIAGNOSIS — I5032 Chronic diastolic (congestive) heart failure: Secondary | ICD-10-CM | POA: Diagnosis not present

## 2023-03-22 DIAGNOSIS — J90 Pleural effusion, not elsewhere classified: Secondary | ICD-10-CM | POA: Diagnosis not present

## 2023-03-22 DIAGNOSIS — J811 Chronic pulmonary edema: Secondary | ICD-10-CM | POA: Diagnosis not present

## 2023-03-22 DIAGNOSIS — E8770 Fluid overload, unspecified: Secondary | ICD-10-CM | POA: Diagnosis not present

## 2023-03-22 DIAGNOSIS — Q231 Congenital insufficiency of aortic valve: Secondary | ICD-10-CM | POA: Diagnosis not present

## 2023-03-22 DIAGNOSIS — G4733 Obstructive sleep apnea (adult) (pediatric): Secondary | ICD-10-CM | POA: Diagnosis present

## 2023-03-22 DIAGNOSIS — Z48812 Encounter for surgical aftercare following surgery on the circulatory system: Secondary | ICD-10-CM | POA: Diagnosis not present

## 2023-03-22 DIAGNOSIS — Z953 Presence of xenogenic heart valve: Secondary | ICD-10-CM

## 2023-03-22 DIAGNOSIS — Z90711 Acquired absence of uterus with remaining cervical stump: Secondary | ICD-10-CM | POA: Diagnosis not present

## 2023-03-22 DIAGNOSIS — R918 Other nonspecific abnormal finding of lung field: Secondary | ICD-10-CM | POA: Diagnosis not present

## 2023-03-22 DIAGNOSIS — Z4682 Encounter for fitting and adjustment of non-vascular catheter: Secondary | ICD-10-CM | POA: Diagnosis not present

## 2023-03-22 DIAGNOSIS — G4730 Sleep apnea, unspecified: Secondary | ICD-10-CM

## 2023-03-22 DIAGNOSIS — I517 Cardiomegaly: Secondary | ICD-10-CM | POA: Diagnosis not present

## 2023-03-22 HISTORY — PX: TEE WITHOUT CARDIOVERSION: SHX5443

## 2023-03-22 HISTORY — PX: AORTIC VALVE REPLACEMENT: SHX41

## 2023-03-22 LAB — POCT I-STAT 7, (LYTES, BLD GAS, ICA,H+H)
Acid-base deficit: 2 mmol/L (ref 0.0–2.0)
Acid-base deficit: 3 mmol/L — ABNORMAL HIGH (ref 0.0–2.0)
Acid-base deficit: 6 mmol/L — ABNORMAL HIGH (ref 0.0–2.0)
Acid-base deficit: 2 mmol/L (ref 0.0–2.0)
Acid-base deficit: 4 mmol/L — ABNORMAL HIGH (ref 0.0–2.0)
Acid-base deficit: 3 mmol/L — ABNORMAL HIGH (ref 0.0–2.0)
Acid-base deficit: 3 mmol/L — ABNORMAL HIGH (ref 0.0–2.0)
Acid-base deficit: 3 mmol/L — ABNORMAL HIGH (ref 0.0–2.0)
Bicarbonate: 22.7 mmol/L (ref 20.0–28.0)
Bicarbonate: 21.1 mmol/L (ref 20.0–28.0)
Bicarbonate: 20.9 mmol/L (ref 20.0–28.0)
Bicarbonate: 23.9 mmol/L (ref 20.0–28.0)
Bicarbonate: 21.2 mmol/L (ref 20.0–28.0)
Bicarbonate: 22.2 mmol/L (ref 20.0–28.0)
Bicarbonate: 22.4 mmol/L (ref 20.0–28.0)
Bicarbonate: 22.6 mmol/L (ref 20.0–28.0)
Calcium, Ion: 1.09 mmol/L — ABNORMAL LOW (ref 1.15–1.40)
Calcium, Ion: 1.07 mmol/L — ABNORMAL LOW (ref 1.15–1.40)
Calcium, Ion: 1.11 mmol/L — ABNORMAL LOW (ref 1.15–1.40)
Calcium, Ion: 1.09 mmol/L — ABNORMAL LOW (ref 1.15–1.40)
Calcium, Ion: 1.04 mmol/L — ABNORMAL LOW (ref 1.15–1.40)
Calcium, Ion: 1.08 mmol/L — ABNORMAL LOW (ref 1.15–1.40)
Calcium, Ion: 1.08 mmol/L — ABNORMAL LOW (ref 1.15–1.40)
Calcium, Ion: 1.11 mmol/L — ABNORMAL LOW (ref 1.15–1.40)
HCT: 25 % — ABNORMAL LOW (ref 36.0–46.0)
HCT: 22 % — ABNORMAL LOW (ref 36.0–46.0)
HCT: 30 % — ABNORMAL LOW (ref 36.0–46.0)
HCT: 27 % — ABNORMAL LOW (ref 36.0–46.0)
HCT: 26 % — ABNORMAL LOW (ref 36.0–46.0)
HCT: 27 % — ABNORMAL LOW (ref 36.0–46.0)
HCT: 26 % — ABNORMAL LOW (ref 36.0–46.0)
HCT: 27 % — ABNORMAL LOW (ref 36.0–46.0)
Hemoglobin: 8.5 g/dL — ABNORMAL LOW (ref 12.0–15.0)
Hemoglobin: 7.5 g/dL — ABNORMAL LOW (ref 12.0–15.0)
Hemoglobin: 10.2 g/dL — ABNORMAL LOW (ref 12.0–15.0)
Hemoglobin: 9.2 g/dL — ABNORMAL LOW (ref 12.0–15.0)
Hemoglobin: 8.8 g/dL — ABNORMAL LOW (ref 12.0–15.0)
Hemoglobin: 9.2 g/dL — ABNORMAL LOW (ref 12.0–15.0)
Hemoglobin: 8.8 g/dL — ABNORMAL LOW (ref 12.0–15.0)
Hemoglobin: 9.2 g/dL — ABNORMAL LOW (ref 12.0–15.0)
O2 Saturation: 100 %
O2 Saturation: 100 %
O2 Saturation: 96 %
O2 Saturation: 97 %
O2 Saturation: 96 %
O2 Saturation: 91 %
O2 Saturation: 95 %
O2 Saturation: 92 %
Patient temperature: 36.3
Patient temperature: 35.9
Patient temperature: 97.9
Patient temperature: 36.3
Patient temperature: 36.3
Patient temperature: 97.9
Potassium: 4.2 mmol/L (ref 3.5–5.1)
Potassium: 4 mmol/L (ref 3.5–5.1)
Potassium: 3.9 mmol/L (ref 3.5–5.1)
Potassium: 3.8 mmol/L (ref 3.5–5.1)
Potassium: 4 mmol/L (ref 3.5–5.1)
Potassium: 3.9 mmol/L (ref 3.5–5.1)
Potassium: 3.8 mmol/L (ref 3.5–5.1)
Potassium: 3.6 mmol/L (ref 3.5–5.1)
Sodium: 139 mmol/L (ref 135–145)
Sodium: 140 mmol/L (ref 135–145)
Sodium: 142 mmol/L (ref 135–145)
Sodium: 145 mmol/L (ref 135–145)
Sodium: 144 mmol/L (ref 135–145)
Sodium: 144 mmol/L (ref 135–145)
Sodium: 144 mmol/L (ref 135–145)
Sodium: 144 mmol/L (ref 135–145)
TCO2: 24 mmol/L (ref 22–32)
TCO2: 22 mmol/L (ref 22–32)
TCO2: 22 mmol/L (ref 22–32)
TCO2: 25 mmol/L (ref 22–32)
TCO2: 22 mmol/L (ref 22–32)
TCO2: 23 mmol/L (ref 22–32)
TCO2: 24 mmol/L (ref 22–32)
TCO2: 24 mmol/L (ref 22–32)
pCO2 arterial: 37.4 mmHg (ref 32–48)
pCO2 arterial: 33.3 mmHg (ref 32–48)
pCO2 arterial: 42.9 mmHg (ref 32–48)
pCO2 arterial: 41 mmHg (ref 32–48)
pCO2 arterial: 38.7 mmHg (ref 32–48)
pCO2 arterial: 38.9 mmHg (ref 32–48)
pCO2 arterial: 40.3 mmHg (ref 32–48)
pCO2 arterial: 39.8 mmHg (ref 32–48)
pH, Arterial: 7.391 (ref 7.35–7.45)
pH, Arterial: 7.41 (ref 7.35–7.45)
pH, Arterial: 7.292 — ABNORMAL LOW (ref 7.35–7.45)
pH, Arterial: 7.368 (ref 7.35–7.45)
pH, Arterial: 7.344 — ABNORMAL LOW (ref 7.35–7.45)
pH, Arterial: 7.361 (ref 7.35–7.45)
pH, Arterial: 7.351 (ref 7.35–7.45)
pH, Arterial: 7.36 (ref 7.35–7.45)
pO2, Arterial: 376 mmHg — ABNORMAL HIGH (ref 83–108)
pO2, Arterial: 171 mmHg — ABNORMAL HIGH (ref 83–108)
pO2, Arterial: 86 mmHg (ref 83–108)
pO2, Arterial: 93 mmHg (ref 83–108)
pO2, Arterial: 81 mmHg — ABNORMAL LOW (ref 83–108)
pO2, Arterial: 61 mmHg — ABNORMAL LOW (ref 83–108)
pO2, Arterial: 76 mmHg — ABNORMAL LOW (ref 83–108)
pO2, Arterial: 66 mmHg — ABNORMAL LOW (ref 83–108)

## 2023-03-22 LAB — POCT I-STAT EG7
Acid-base deficit: 3 mmol/L — ABNORMAL HIGH (ref 0.0–2.0)
Bicarbonate: 22.5 mmol/L (ref 20.0–28.0)
Calcium, Ion: 1.1 mmol/L — ABNORMAL LOW (ref 1.15–1.40)
HCT: 23 % — ABNORMAL LOW (ref 36.0–46.0)
Hemoglobin: 7.8 g/dL — ABNORMAL LOW (ref 12.0–15.0)
O2 Saturation: 82 %
Potassium: 4.6 mmol/L (ref 3.5–5.1)
Sodium: 140 mmol/L (ref 135–145)
TCO2: 24 mmol/L (ref 22–32)
pCO2, Ven: 39.6 mmHg — ABNORMAL LOW (ref 44–60)
pH, Ven: 7.364 (ref 7.25–7.43)
pO2, Ven: 47 mmHg — ABNORMAL HIGH (ref 32–45)

## 2023-03-22 LAB — MAGNESIUM: Magnesium: 2.3 mg/dL (ref 1.7–2.4)

## 2023-03-22 LAB — CBC
HCT: 30.2 % — ABNORMAL LOW (ref 36.0–46.0)
HCT: 27.7 % — ABNORMAL LOW (ref 36.0–46.0)
Hemoglobin: 10 g/dL — ABNORMAL LOW (ref 12.0–15.0)
Hemoglobin: 9.2 g/dL — ABNORMAL LOW (ref 12.0–15.0)
MCH: 30.4 pg (ref 26.0–34.0)
MCH: 30.3 pg (ref 26.0–34.0)
MCHC: 33.1 g/dL (ref 30.0–36.0)
MCHC: 33.2 g/dL (ref 30.0–36.0)
MCV: 91.8 fL (ref 80.0–100.0)
MCV: 91.1 fL (ref 80.0–100.0)
Platelets: 173 10*3/uL (ref 150–400)
Platelets: 142 10*3/uL — ABNORMAL LOW (ref 150–400)
RBC: 3.29 MIL/uL — ABNORMAL LOW (ref 3.87–5.11)
RBC: 3.04 MIL/uL — ABNORMAL LOW (ref 3.87–5.11)
RDW: 13.2 % (ref 11.5–15.5)
RDW: 13.3 % (ref 11.5–15.5)
WBC: 14.8 10*3/uL — ABNORMAL HIGH (ref 4.0–10.5)
WBC: 13.8 10*3/uL — ABNORMAL HIGH (ref 4.0–10.5)
nRBC: 0 % (ref 0.0–0.2)
nRBC: 0 % (ref 0.0–0.2)

## 2023-03-22 LAB — PREPARE RBC (CROSSMATCH)

## 2023-03-22 LAB — POCT I-STAT, CHEM 8
BUN: 17 mg/dL (ref 8–23)
BUN: 16 mg/dL (ref 8–23)
BUN: 16 mg/dL (ref 8–23)
BUN: 15 mg/dL (ref 8–23)
Calcium, Ion: 1.23 mmol/L (ref 1.15–1.40)
Calcium, Ion: 1.09 mmol/L — ABNORMAL LOW (ref 1.15–1.40)
Calcium, Ion: 1.17 mmol/L (ref 1.15–1.40)
Calcium, Ion: 1.06 mmol/L — ABNORMAL LOW (ref 1.15–1.40)
Chloride: 106 mmol/L (ref 98–111)
Chloride: 107 mmol/L (ref 98–111)
Chloride: 108 mmol/L (ref 98–111)
Chloride: 106 mmol/L (ref 98–111)
Creatinine, Ser: 0.6 mg/dL (ref 0.44–1.00)
Creatinine, Ser: 0.6 mg/dL (ref 0.44–1.00)
Creatinine, Ser: 0.6 mg/dL (ref 0.44–1.00)
Creatinine, Ser: 0.6 mg/dL (ref 0.44–1.00)
Glucose, Bld: 111 mg/dL — ABNORMAL HIGH (ref 70–99)
Glucose, Bld: 112 mg/dL — ABNORMAL HIGH (ref 70–99)
Glucose, Bld: 90 mg/dL (ref 70–99)
Glucose, Bld: 181 mg/dL — ABNORMAL HIGH (ref 70–99)
HCT: 30 % — ABNORMAL LOW (ref 36.0–46.0)
HCT: 25 % — ABNORMAL LOW (ref 36.0–46.0)
HCT: 27 % — ABNORMAL LOW (ref 36.0–46.0)
HCT: 24 % — ABNORMAL LOW (ref 36.0–46.0)
Hemoglobin: 10.2 g/dL — ABNORMAL LOW (ref 12.0–15.0)
Hemoglobin: 8.5 g/dL — ABNORMAL LOW (ref 12.0–15.0)
Hemoglobin: 9.2 g/dL — ABNORMAL LOW (ref 12.0–15.0)
Hemoglobin: 8.2 g/dL — ABNORMAL LOW (ref 12.0–15.0)
Potassium: 3.8 mmol/L (ref 3.5–5.1)
Potassium: 3.7 mmol/L (ref 3.5–5.1)
Potassium: 4.2 mmol/L (ref 3.5–5.1)
Potassium: 4 mmol/L (ref 3.5–5.1)
Sodium: 140 mmol/L (ref 135–145)
Sodium: 140 mmol/L (ref 135–145)
Sodium: 140 mmol/L (ref 135–145)
Sodium: 139 mmol/L (ref 135–145)
TCO2: 21 mmol/L — ABNORMAL LOW (ref 22–32)
TCO2: 21 mmol/L — ABNORMAL LOW (ref 22–32)
TCO2: 24 mmol/L (ref 22–32)
TCO2: 21 mmol/L — ABNORMAL LOW (ref 22–32)

## 2023-03-22 LAB — ECHO INTRAOPERATIVE TEE
AR max vel: 0.71 cm2
AV Area VTI: 0.77 cm2
AV Area mean vel: 0.76 cm2
AV Mean grad: 23 mmHg
AV Peak grad: 39.4 mmHg
Ao pk vel: 3.14 m/s
Area-P 1/2: 2.47 cm2
Height: 63 in
MV VTI: 1.79 cm2
P 1/2 time: 736 ms
Weight: 2480 [oz_av]

## 2023-03-22 LAB — PROTIME-INR
INR: 1.4 — ABNORMAL HIGH (ref 0.8–1.2)
Prothrombin Time: 17.6 s — ABNORMAL HIGH (ref 11.4–15.2)

## 2023-03-22 LAB — GLUCOSE, CAPILLARY
Glucose-Capillary: 130 mg/dL — ABNORMAL HIGH (ref 70–99)
Glucose-Capillary: 114 mg/dL — ABNORMAL HIGH (ref 70–99)
Glucose-Capillary: 123 mg/dL — ABNORMAL HIGH (ref 70–99)
Glucose-Capillary: 127 mg/dL — ABNORMAL HIGH (ref 70–99)
Glucose-Capillary: 132 mg/dL — ABNORMAL HIGH (ref 70–99)
Glucose-Capillary: 152 mg/dL — ABNORMAL HIGH (ref 70–99)
Glucose-Capillary: 147 mg/dL — ABNORMAL HIGH (ref 70–99)
Glucose-Capillary: 161 mg/dL — ABNORMAL HIGH (ref 70–99)
Glucose-Capillary: 158 mg/dL — ABNORMAL HIGH (ref 70–99)
Glucose-Capillary: 148 mg/dL — ABNORMAL HIGH (ref 70–99)
Glucose-Capillary: 176 mg/dL — ABNORMAL HIGH (ref 70–99)
Glucose-Capillary: 128 mg/dL — ABNORMAL HIGH (ref 70–99)
Glucose-Capillary: 132 mg/dL — ABNORMAL HIGH (ref 70–99)

## 2023-03-22 LAB — BASIC METABOLIC PANEL
Anion gap: 9 (ref 5–15)
BUN: 15 mg/dL (ref 8–23)
CO2: 21 mmol/L — ABNORMAL LOW (ref 22–32)
Calcium: 7.5 mg/dL — ABNORMAL LOW (ref 8.9–10.3)
Chloride: 108 mmol/L (ref 98–111)
Creatinine, Ser: 0.71 mg/dL (ref 0.44–1.00)
GFR, Estimated: >60 mL/min (ref >60)
Glucose, Bld: 155 mg/dL — ABNORMAL HIGH (ref 70–99)
Potassium: 3.5 mmol/L (ref 3.5–5.1)
Sodium: 138 mmol/L (ref 135–145)

## 2023-03-22 LAB — HEMOGLOBIN AND HEMATOCRIT, BLOOD
HCT: 21.9 % — ABNORMAL LOW (ref 36.0–46.0)
Hemoglobin: 7.3 g/dL — ABNORMAL LOW (ref 12.0–15.0)

## 2023-03-22 LAB — APTT: aPTT: 39 s — ABNORMAL HIGH (ref 24–36)

## 2023-03-22 LAB — PLATELET COUNT: Platelets: 169 10*3/uL (ref 150–400)

## 2023-03-22 SURGERY — REPLACEMENT, AORTIC VALVE, OPEN
Anesthesia: General | Site: Chest

## 2023-03-22 MED ORDER — ORAL CARE MOUTH RINSE: 15.0000 mL | OROMUCOSAL | Status: DC | PRN

## 2023-03-22 MED ORDER — LEVOTHYROXINE SODIUM 112 MCG PO TABS
112.0000 ug | ORAL_TABLET | Freq: Every day | ORAL | Status: DC
  Administered 2023-03-23 – 2023-03-28 (×6): 112 ug via ORAL
  Filled 2023-03-22 (×7): qty 1

## 2023-03-22 MED ORDER — DEXTROSE 50 % IV SOLN: 0.0000 mL | INTRAVENOUS | Status: DC | PRN

## 2023-03-22 MED ORDER — THROMBIN (RECOMBINANT) 20000 UNITS EX SOLR
CUTANEOUS | Status: AC
  Filled 2023-03-22: qty 20000

## 2023-03-22 MED ORDER — NICARDIPINE HCL IN NACL 20-0.86 MG/200ML-% IV SOLN
0.0000 mg/h | INTRAVENOUS | Status: DC
  Filled 2023-03-22: qty 200

## 2023-03-22 MED ORDER — PANTOPRAZOLE SODIUM 40 MG IV SOLR
40.0000 mg | Freq: Every day | INTRAVENOUS | Status: AC
  Administered 2023-03-22 – 2023-03-23 (×2): 40 mg via INTRAVENOUS
  Filled 2023-03-22 (×2): qty 10

## 2023-03-22 MED ORDER — SODIUM CHLORIDE 0.9 % IV SOLN: INTRAVENOUS | Status: DC | PRN

## 2023-03-22 MED ORDER — METOPROLOL TARTRATE 12.5 MG HALF TABLET
12.5000 mg | ORAL_TABLET | Freq: Once | ORAL | Status: AC
  Administered 2023-03-22: 12.5 mg via ORAL
  Filled 2023-03-22: qty 1

## 2023-03-22 MED ORDER — METOPROLOL TARTRATE 12.5 MG HALF TABLET
12.5000 mg | ORAL_TABLET | Freq: Two times a day (BID) | ORAL | Status: DC
  Administered 2023-03-23 – 2023-03-24 (×4): 12.5 mg via ORAL
  Filled 2023-03-22 (×4): qty 1

## 2023-03-22 MED ORDER — ACETAMINOPHEN 160 MG/5ML PO SOLN: 650.0000 mg | Freq: Once | ORAL | Status: DC

## 2023-03-22 MED ORDER — MAGNESIUM SULFATE 4 GM/100ML IV SOLN
4.0000 g | Freq: Once | INTRAVENOUS | Status: AC
  Administered 2023-03-22: 4 g via INTRAVENOUS
  Filled 2023-03-22: qty 100

## 2023-03-22 MED ORDER — FENTANYL CITRATE (PF) 250 MCG/5ML IJ SOLN
INTRAMUSCULAR | Status: AC
  Filled 2023-03-22: qty 5

## 2023-03-22 MED ORDER — PROPOFOL 10 MG/ML IV BOLUS
INTRAVENOUS | Status: DC | PRN
  Administered 2023-03-22: 40 mg via INTRAVENOUS

## 2023-03-22 MED ORDER — FENTANYL CITRATE (PF) 250 MCG/5ML IJ SOLN
INTRAMUSCULAR | Status: DC | PRN
  Administered 2023-03-22 (×4): 100 ug via INTRAVENOUS
  Administered 2023-03-22: 200 ug via INTRAVENOUS
  Administered 2023-03-22: 50 ug via INTRAVENOUS
  Administered 2023-03-22: 100 ug via INTRAVENOUS

## 2023-03-22 MED ORDER — BISACODYL 10 MG RE SUPP: 10.0000 mg | Freq: Every day | RECTAL | Status: DC

## 2023-03-22 MED ORDER — METHYLPREDNISOLONE SODIUM SUCC 125 MG IJ SOLR
INTRAMUSCULAR | Status: AC
  Administered 2023-03-22: 62.5 mg
  Filled 2023-03-22: qty 2

## 2023-03-22 MED ORDER — METOPROLOL TARTRATE 5 MG/5ML IV SOLN
2.5000 mg | INTRAVENOUS | Status: DC | PRN
  Administered 2023-03-22: 2 mg via INTRAVENOUS
  Administered 2023-03-23: 5 mg via INTRAVENOUS
  Filled 2023-03-22 (×2): qty 5

## 2023-03-22 MED ORDER — SODIUM CHLORIDE 0.9% FLUSH
3.0000 mL | Freq: Two times a day (BID) | INTRAVENOUS | Status: DC
  Administered 2023-03-23 – 2023-03-28 (×11): 3 mL via INTRAVENOUS

## 2023-03-22 MED ORDER — HEPARIN SODIUM (PORCINE) 1000 UNIT/ML IJ SOLN
INTRAMUSCULAR | Status: DC | PRN
  Administered 2023-03-22: 22000 [IU] via INTRAVENOUS
  Administered 2023-03-22: 5000 [IU] via INTRAVENOUS

## 2023-03-22 MED ORDER — SODIUM BICARBONATE 8.4 % IV SOLN
INTRAVENOUS | Status: AC
  Filled 2023-03-22: qty 50

## 2023-03-22 MED ORDER — ONDANSETRON HCL 4 MG/2ML IJ SOLN
4.0000 mg | Freq: Four times a day (QID) | INTRAMUSCULAR | Status: DC | PRN
  Administered 2023-03-23: 4 mg via INTRAVENOUS
  Filled 2023-03-22: qty 2

## 2023-03-22 MED ORDER — MORPHINE SULFATE (PF) 2 MG/ML IV SOLN
1.0000 mg | INTRAVENOUS | Status: DC | PRN
  Filled 2023-03-22: qty 2

## 2023-03-22 MED ORDER — METHYLPREDNISOLONE SODIUM SUCC 40 MG IJ SOLR
INTRAMUSCULAR | Status: AC
  Filled 2023-03-22: qty 2

## 2023-03-22 MED ORDER — POTASSIUM CHLORIDE 10 MEQ/50ML IV SOLN
10.0000 meq | INTRAVENOUS | Status: AC
  Administered 2023-03-22 (×2): 10 meq via INTRAVENOUS

## 2023-03-22 MED ORDER — VANCOMYCIN HCL IN DEXTROSE 1-5 GM/200ML-% IV SOLN
1000.0000 mg | Freq: Once | INTRAVENOUS | Status: AC
  Administered 2023-03-22: 1000 mg via INTRAVENOUS
  Filled 2023-03-22: qty 200

## 2023-03-22 MED ORDER — HEMOSTATIC AGENTS (NO CHARGE) OPTIME
TOPICAL | Status: DC | PRN
Start: 2023-03-22 — End: 2023-03-22
  Administered 2023-03-22: 1 via TOPICAL

## 2023-03-22 MED ORDER — MIDAZOLAM HCL 2 MG/2ML IJ SOLN
2.0000 mg | INTRAMUSCULAR | Status: DC | PRN
  Administered 2023-03-22 (×2): 2 mg via INTRAVENOUS
  Filled 2023-03-22 (×2): qty 2

## 2023-03-22 MED ORDER — LIDOCAINE 2% (20 MG/ML) 5 ML SYRINGE
INTRAMUSCULAR | Status: AC
  Filled 2023-03-22: qty 5

## 2023-03-22 MED ORDER — TRAMADOL HCL 50 MG PO TABS
50.0000 mg | ORAL_TABLET | Freq: Four times a day (QID) | ORAL | Status: DC | PRN
  Administered 2023-03-22: 50 mg via ORAL
  Filled 2023-03-22 (×2): qty 1

## 2023-03-22 MED ORDER — HEPARIN SODIUM (PORCINE) 1000 UNIT/ML IJ SOLN
INTRAMUSCULAR | Status: AC
  Filled 2023-03-22: qty 1

## 2023-03-22 MED ORDER — ROCURONIUM BROMIDE 10 MG/ML (PF) SYRINGE
PREFILLED_SYRINGE | INTRAVENOUS | Status: AC
  Filled 2023-03-22: qty 10

## 2023-03-22 MED ORDER — LACTATED RINGERS IV SOLN: INTRAVENOUS | Status: DC

## 2023-03-22 MED ORDER — METOCLOPRAMIDE HCL 5 MG/ML IJ SOLN
10.0000 mg | Freq: Four times a day (QID) | INTRAMUSCULAR | Status: AC
  Administered 2023-03-22 – 2023-03-23 (×6): 10 mg via INTRAVENOUS
  Filled 2023-03-22 (×6): qty 2

## 2023-03-22 MED ORDER — PANTOPRAZOLE SODIUM 40 MG PO TBEC
40.0000 mg | DELAYED_RELEASE_TABLET | Freq: Every day | ORAL | Status: DC
  Administered 2023-03-24 – 2023-03-28 (×5): 40 mg via ORAL
  Filled 2023-03-22 (×5): qty 1

## 2023-03-22 MED ORDER — SODIUM CHLORIDE 0.9 % IV SOLN
250.0000 mL | INTRAVENOUS | Status: DC
  Administered 2023-03-23: 250 mL via INTRAVENOUS

## 2023-03-22 MED ORDER — 0.9 % SODIUM CHLORIDE (POUR BTL) OPTIME
TOPICAL | Status: DC | PRN
Start: 2023-03-22 — End: 2023-03-22
  Administered 2023-03-22: 1000 mL

## 2023-03-22 MED ORDER — SODIUM CHLORIDE 0.45 % IV SOLN: INTRAVENOUS | Status: DC | PRN

## 2023-03-22 MED ORDER — ALBUMIN HUMAN 5 % IV SOLN: 12.5000 g | Freq: Once | INTRAVENOUS | Status: DC | PRN

## 2023-03-22 MED ORDER — INSULIN REGULAR(HUMAN) IN NACL 100-0.9 UT/100ML-% IV SOLN: INTRAVENOUS | Status: DC

## 2023-03-22 MED ORDER — CHLORHEXIDINE GLUCONATE 0.12 % MT SOLN
15.0000 mL | Freq: Once | OROMUCOSAL | Status: AC
  Administered 2023-03-22: 15 mL via OROMUCOSAL
  Filled 2023-03-22: qty 15

## 2023-03-22 MED ORDER — PHENYLEPHRINE 80 MCG/ML (10ML) SYRINGE FOR IV PUSH (FOR BLOOD PRESSURE SUPPORT)
PREFILLED_SYRINGE | INTRAVENOUS | Status: AC
  Filled 2023-03-22: qty 10

## 2023-03-22 MED ORDER — ASPIRIN 325 MG PO TBEC
325.0000 mg | DELAYED_RELEASE_TABLET | Freq: Every day | ORAL | Status: DC
  Administered 2023-03-23 – 2023-03-28 (×6): 325 mg via ORAL
  Filled 2023-03-22 (×6): qty 1

## 2023-03-22 MED ORDER — LACTATED RINGERS IV SOLN: INTRAVENOUS | Status: DC | PRN

## 2023-03-22 MED ORDER — POTASSIUM CHLORIDE CRYS ER 20 MEQ PO TBCR
20.0000 meq | EXTENDED_RELEASE_TABLET | ORAL | Status: AC
  Administered 2023-03-22 – 2023-03-23 (×3): 20 meq via ORAL
  Filled 2023-03-22 (×2): qty 1

## 2023-03-22 MED ORDER — LACTATED RINGERS IV BOLUS
1000.0000 mL | Freq: Once | INTRAVENOUS | Status: AC
  Administered 2023-03-22: 1000 mL via INTRAVENOUS

## 2023-03-22 MED ORDER — THROMBIN 20000 UNITS EX SOLR: OROMUCOSAL | Status: DC | PRN

## 2023-03-22 MED ORDER — PROPOFOL 10 MG/ML IV BOLUS
INTRAVENOUS | Status: AC
  Filled 2023-03-22: qty 20

## 2023-03-22 MED ORDER — SODIUM CHLORIDE 0.9% FLUSH: 3.0000 mL | INTRAVENOUS | Status: DC | PRN

## 2023-03-22 MED ORDER — CEFAZOLIN SODIUM-DEXTROSE 2-4 GM/100ML-% IV SOLN
2.0000 g | Freq: Three times a day (TID) | INTRAVENOUS | Status: AC
  Administered 2023-03-22 – 2023-03-24 (×6): 2 g via INTRAVENOUS
  Filled 2023-03-22 (×6): qty 100

## 2023-03-22 MED ORDER — CHLORHEXIDINE GLUCONATE 4 % EX SOLN: 30.0000 mL | CUTANEOUS | Status: DC

## 2023-03-22 MED ORDER — SODIUM CHLORIDE 0.9 % IV SOLN: INTRAVENOUS | Status: DC

## 2023-03-22 MED ORDER — ASPIRIN 81 MG PO CHEW
324.0000 mg | CHEWABLE_TABLET | Freq: Once | ORAL | Status: AC
  Administered 2023-03-22: 324 mg via ORAL
  Filled 2023-03-22: qty 4

## 2023-03-22 MED ORDER — ~~LOC~~ CARDIAC SURGERY, PATIENT & FAMILY EDUCATION
Freq: Once | Status: DC
  Filled 2023-03-22: qty 1

## 2023-03-22 MED ORDER — ASPIRIN 81 MG PO CHEW: 324.0000 mg | CHEWABLE_TABLET | Freq: Every day | ORAL | Status: DC

## 2023-03-22 MED ORDER — ACETAMINOPHEN 500 MG PO TABS
1000.0000 mg | ORAL_TABLET | Freq: Four times a day (QID) | ORAL | Status: AC
  Administered 2023-03-23 – 2023-03-27 (×20): 1000 mg via ORAL
  Filled 2023-03-22 (×19): qty 2

## 2023-03-22 MED ORDER — PROTAMINE SULFATE 10 MG/ML IV SOLN
INTRAVENOUS | Status: DC | PRN
  Administered 2023-03-22: 200 mg via INTRAVENOUS

## 2023-03-22 MED ORDER — BISACODYL 5 MG PO TBEC
10.0000 mg | DELAYED_RELEASE_TABLET | Freq: Every day | ORAL | Status: DC
  Administered 2023-03-23 – 2023-03-24 (×2): 10 mg via ORAL
  Filled 2023-03-22 (×2): qty 2

## 2023-03-22 MED ORDER — ACETAMINOPHEN 160 MG/5ML PO SOLN
1000.0000 mg | Freq: Four times a day (QID) | ORAL | Status: AC
  Filled 2023-03-22: qty 40.6

## 2023-03-22 MED ORDER — PHENYLEPHRINE HCL-NACL 20-0.9 MG/250ML-% IV SOLN: 0.0000 ug/min | INTRAVENOUS | Status: DC

## 2023-03-22 MED ORDER — ALBUMIN HUMAN 5 % IV SOLN
250.0000 mL | INTRAVENOUS | Status: AC | PRN
  Administered 2023-03-22 (×5): 12.5 g via INTRAVENOUS
  Filled 2023-03-22 (×3): qty 250

## 2023-03-22 MED ORDER — PROTAMINE SULFATE 10 MG/ML IV SOLN
INTRAVENOUS | Status: AC
  Filled 2023-03-22: qty 25

## 2023-03-22 MED ORDER — SODIUM BICARBONATE 8.4 % IV SOLN
100.0000 meq | Freq: Once | INTRAVENOUS | Status: AC
  Administered 2023-03-22: 100 meq via INTRAVENOUS

## 2023-03-22 MED ORDER — ATORVASTATIN CALCIUM 10 MG PO TABS
10.0000 mg | ORAL_TABLET | Freq: Every day | ORAL | Status: DC
  Administered 2023-03-23 – 2023-03-28 (×6): 10 mg via ORAL
  Filled 2023-03-22 (×6): qty 1

## 2023-03-22 MED ORDER — OXYCODONE HCL 5 MG PO TABS
5.0000 mg | ORAL_TABLET | ORAL | Status: DC | PRN
  Administered 2023-03-23 (×2): 5 mg via ORAL
  Filled 2023-03-22 (×2): qty 1

## 2023-03-22 MED ORDER — METOPROLOL TARTRATE 25 MG/10 ML ORAL SUSPENSION: 12.5000 mg | Freq: Two times a day (BID) | ORAL | Status: DC

## 2023-03-22 MED ORDER — SODIUM BICARBONATE 8.4 % IV SOLN
50.0000 meq | Freq: Once | INTRAVENOUS | Status: AC
  Administered 2023-03-22: 50 meq via INTRAVENOUS

## 2023-03-22 MED ORDER — CHLORHEXIDINE GLUCONATE 0.12 % MT SOLN
15.0000 mL | OROMUCOSAL | Status: AC
  Administered 2023-03-22: 15 mL via OROMUCOSAL
  Filled 2023-03-22: qty 15

## 2023-03-22 MED ORDER — ROCURONIUM BROMIDE 10 MG/ML (PF) SYRINGE
PREFILLED_SYRINGE | INTRAVENOUS | Status: DC | PRN
  Administered 2023-03-22: 100 mg via INTRAVENOUS
  Administered 2023-03-22: 30 mg via INTRAVENOUS
  Administered 2023-03-22: 50 mg via INTRAVENOUS

## 2023-03-22 MED ORDER — ACETAMINOPHEN 325 MG PO TABS
650.0000 mg | ORAL_TABLET | Freq: Once | ORAL | Status: AC
  Administered 2023-03-22: 650 mg via ORAL
  Filled 2023-03-22: qty 2

## 2023-03-22 MED ORDER — DEXMEDETOMIDINE HCL IN NACL 400 MCG/100ML IV SOLN: 0.0000 ug/kg/h | INTRAVENOUS | Status: DC

## 2023-03-22 MED ORDER — NOREPINEPHRINE 4 MG/250ML-% IV SOLN
0.0000 ug/min | INTRAVENOUS | Status: DC
  Administered 2023-03-22: 8 ug/min via INTRAVENOUS
  Administered 2023-03-22: 1 ug/min via INTRAVENOUS

## 2023-03-22 MED ORDER — CHLORHEXIDINE GLUCONATE CLOTH 2 % EX PADS
6.0000 | MEDICATED_PAD | Freq: Every day | CUTANEOUS | Status: DC
  Administered 2023-03-22 – 2023-03-27 (×6): 6 via TOPICAL

## 2023-03-22 MED ORDER — METHYLPREDNISOLONE SODIUM SUCC 125 MG IJ SOLR
60.0000 mg | Freq: Once | INTRAMUSCULAR | Status: AC
  Administered 2023-03-22: 60 mg via INTRAVENOUS

## 2023-03-22 MED ORDER — LIDOCAINE 2% (20 MG/ML) 5 ML SYRINGE
INTRAMUSCULAR | Status: DC | PRN
  Administered 2023-03-22: 100 mg via INTRAVENOUS

## 2023-03-22 MED ORDER — CLEVIDIPINE BUTYRATE 0.5 MG/ML IV EMUL
INTRAVENOUS | Status: AC
  Filled 2023-03-22: qty 50

## 2023-03-22 MED ORDER — MIDAZOLAM HCL (PF) 10 MG/2ML IJ SOLN
INTRAMUSCULAR | Status: AC
  Filled 2023-03-22: qty 2

## 2023-03-22 MED ORDER — PHENYLEPHRINE 80 MCG/ML (10ML) SYRINGE FOR IV PUSH (FOR BLOOD PRESSURE SUPPORT)
PREFILLED_SYRINGE | INTRAVENOUS | Status: DC | PRN
  Administered 2023-03-22: 80 ug via INTRAVENOUS
  Administered 2023-03-22 (×2): 40 ug via INTRAVENOUS

## 2023-03-22 MED ORDER — MIDAZOLAM HCL (PF) 5 MG/ML IJ SOLN
INTRAMUSCULAR | Status: DC | PRN
  Administered 2023-03-22: 2 mg via INTRAVENOUS
  Administered 2023-03-22 (×2): 1 mg via INTRAVENOUS

## 2023-03-22 MED ORDER — DOCUSATE SODIUM 100 MG PO CAPS
200.0000 mg | ORAL_CAPSULE | Freq: Every day | ORAL | Status: DC
  Administered 2023-03-23 – 2023-03-24 (×2): 200 mg via ORAL
  Filled 2023-03-22 (×2): qty 2

## 2023-03-22 MED ORDER — THROMBIN (RECOMBINANT) 20000 UNITS EX SOLR
CUTANEOUS | Status: DC | PRN
Start: 2023-03-22 — End: 2023-03-22
  Administered 2023-03-22: 20000 [IU] via TOPICAL

## 2023-03-22 MED FILL — Gelatin Absorbable MT Powder: OROMUCOSAL | Qty: 1 | Status: AC

## 2023-03-22 SURGICAL SUPPLY — 79 items
ADAPTER CARDIO PERF ANTE/RETRO (ADAPTER) ×2 IMPLANT
ADPR PRFSN 84XANTGRD RTRGD (ADAPTER) ×2
BAG DECANTER FOR FLEXI CONT (MISCELLANEOUS) ×2 IMPLANT
BLADE CLIPPER SURG (BLADE) ×2 IMPLANT
BLADE STERNUM SYSTEM 6 (BLADE) ×2 IMPLANT
BLADE SURG 15 STRL LF DISP TIS (BLADE) ×2 IMPLANT
BLADE SURG 15 STRL SS (BLADE) ×2
CANISTER SUCT 3000ML PPV (MISCELLANEOUS) ×2 IMPLANT
CANNULA ARTERIAL VENT 3/8 20FR (CANNULA) IMPLANT
CANNULA GUNDRY RCSP 15FR (MISCELLANEOUS) ×2 IMPLANT
CANNULA MC2 2 STG 32/40 NON-V (CANNULA) IMPLANT
CATH HEART VENT LEFT (CATHETERS) ×2 IMPLANT
CATH ROBINSON RED A/P 18FR (CATHETERS) ×6 IMPLANT
CATH THORACIC 36FR (CATHETERS) ×2 IMPLANT
CATH THORACIC 36FR RT ANG (CATHETERS) ×2 IMPLANT
CNTNR URN SCR LID CUP LEK RST (MISCELLANEOUS) ×2 IMPLANT
CONN DRAIN DUAL-IN SOMAVAC (TUBING) ×2
CONNECTOR DRAIN DUAL-IN SOMAVC (TUBING) IMPLANT
CONT SPEC 4OZ STRL OR WHT (MISCELLANEOUS) ×2
CONTAINER PROTECT SURGISLUSH (MISCELLANEOUS) ×4 IMPLANT
COVER SURGICAL LIGHT HANDLE (MISCELLANEOUS) ×2 IMPLANT
DEVICE SUT CK QUICK LOAD MINI (Prosthesis & Implant Heart) IMPLANT
DRAPE CARDIOVASCULAR INCISE (DRAPES) ×2
DRAPE SRG 135X102X78XABS (DRAPES) ×2 IMPLANT
DRAPE WARM FLUID 44X44 (DRAPES) ×2 IMPLANT
DRSG COVADERM 4X14 (GAUZE/BANDAGES/DRESSINGS) ×2 IMPLANT
ELECT CAUTERY BLADE 6.4 (BLADE) ×2 IMPLANT
ELECT REM PT RETURN 9FT ADLT (ELECTROSURGICAL) ×4
ELECTRODE REM PT RTRN 9FT ADLT (ELECTROSURGICAL) ×4 IMPLANT
FELT TEFLON 1X6 (MISCELLANEOUS) ×4 IMPLANT
GAUZE 4X4 16PLY ~~LOC~~+RFID DBL (SPONGE) ×2 IMPLANT
GAUZE SPONGE 4X4 12PLY STRL (GAUZE/BANDAGES/DRESSINGS) ×2 IMPLANT
GLOVE BIO SURGEON STRL SZ 6 (GLOVE) IMPLANT
GLOVE BIO SURGEON STRL SZ 6.5 (GLOVE) IMPLANT
GLOVE BIO SURGEON STRL SZ7 (GLOVE) IMPLANT
GLOVE BIO SURGEON STRL SZ7.5 (GLOVE) IMPLANT
GLOVE BIOGEL PI IND STRL 7.0 (GLOVE) IMPLANT
GLOVE SURG MICRO LTX SZ7 (GLOVE) ×4 IMPLANT
GOWN STRL REUS W/ TWL LRG LVL3 (GOWN DISPOSABLE) ×8 IMPLANT
GOWN STRL REUS W/ TWL XL LVL3 (GOWN DISPOSABLE) ×2 IMPLANT
GOWN STRL REUS W/TWL LRG LVL3 (GOWN DISPOSABLE) ×8
GOWN STRL REUS W/TWL XL LVL3 (GOWN DISPOSABLE) ×2
HEMOSTAT POWDER SURGIFOAM 1G (HEMOSTASIS) ×6 IMPLANT
HEMOSTAT SURGICEL 2X14 (HEMOSTASIS) ×2 IMPLANT
KIT BASIN OR (CUSTOM PROCEDURE TRAY) ×2 IMPLANT
KIT CATH CPB BARTLE (MISCELLANEOUS) ×2 IMPLANT
KIT SUCTION CATH 14FR (SUCTIONS) ×2 IMPLANT
KIT SUT CK MINI COMBO 4X17 (Prosthesis & Implant Heart) IMPLANT
KIT TURNOVER KIT B (KITS) ×2 IMPLANT
LINE VENT (MISCELLANEOUS) IMPLANT
NS IRRIG 1000ML POUR BTL (IV SOLUTION) ×12 IMPLANT
PACK E OPEN HEART (SUTURE) ×2 IMPLANT
PACK OPEN HEART (CUSTOM PROCEDURE TRAY) ×2 IMPLANT
PAD ARMBOARD 7.5X6 YLW CONV (MISCELLANEOUS) ×4 IMPLANT
POSITIONER HEAD DONUT 9IN (MISCELLANEOUS) ×2 IMPLANT
SET MPS 3-ND DEL (MISCELLANEOUS) IMPLANT
SPONGE T-LAP 18X18 ~~LOC~~+RFID (SPONGE) ×8 IMPLANT
SPONGE T-LAP 4X18 ~~LOC~~+RFID (SPONGE) ×2 IMPLANT
SUT BONE WAX W31G (SUTURE) ×2 IMPLANT
SUT EB EXC GRN/WHT 2-0 V-5 (SUTURE) ×4 IMPLANT
SUT ETHIBON EXCEL 2-0 V-5 (SUTURE) IMPLANT
SUT ETHIBOND V-5 VALVE (SUTURE) IMPLANT
SUT PROLENE 3 0 SH DA (SUTURE) IMPLANT
SUT PROLENE 3 0 SH1 36 (SUTURE) ×2 IMPLANT
SUT PROLENE 4 0 RB 1 (SUTURE) ×8
SUT PROLENE 4-0 RB1 .5 CRCL 36 (SUTURE) ×6 IMPLANT
SUT STEEL 6MS V (SUTURE) IMPLANT
SUT VIC AB 1 CTX 36 (SUTURE) ×4
SUT VIC AB 1 CTX36XBRD ANBCTR (SUTURE) ×4 IMPLANT
SYSTEM SAHARA CHEST DRAIN ATS (WOUND CARE) ×2 IMPLANT
TAPE CLOTH SURG 4X10 WHT LF (GAUZE/BANDAGES/DRESSINGS) IMPLANT
TAPE PAPER 2X10 WHT MICROPORE (GAUZE/BANDAGES/DRESSINGS) IMPLANT
TOWEL GREEN STERILE (TOWEL DISPOSABLE) ×2 IMPLANT
TOWEL GREEN STERILE FF (TOWEL DISPOSABLE) ×2 IMPLANT
TRAY FOLEY SLVR 16FR TEMP STAT (SET/KITS/TRAYS/PACK) ×2 IMPLANT
UNDERPAD 30X36 HEAVY ABSORB (UNDERPADS AND DIAPERS) ×2 IMPLANT
VALVE AORTIC SZ23 INSP/RESIL (Prosthesis & Implant Heart) IMPLANT
VENT LEFT HEART 12002 (CATHETERS) ×2
WATER STERILE IRR 1000ML POUR (IV SOLUTION) ×4 IMPLANT

## 2023-03-22 NOTE — Discharge Summary (Signed)
[address]       [place] [address]             [phone]    Physician Discharge Summary  Patient ID: Holly Patrick MRN: [id number] DOB/AGE: [birth year] 77 y.o.  Admit date: 03/22/2023 Discharge date: 03/28/2023  Admission Diagnoses:  Patient Active Problem List   Diagnosis Date Noted   S/P aortic valve replacement with bioprosthetic valve 03/22/2023   Aortic stenosis, severe 03/22/2023   Status post total hip replacement, right 11/28/2020   Murmur 10/09/2012   HTN (hypertension) 10/09/2012   Discharge Diagnoses:  Patient Active Problem List   Diagnosis Date Noted   S/P aortic valve replacement with bioprosthetic valve 03/22/2023   Aortic stenosis, severe 03/22/2023   Status post total hip replacement, right 11/28/2020   Murmur 10/09/2012   HTN (hypertension) 10/09/2012   Discharged Condition: Stable  HPI: This is a 77 year old active woman with hypertension, hypothyroidism, OSA no longer on CPAP, and aortic stenosis that has been followed by Dr. Eden Emms.  An echocardiogram in May 2022 showed a mean gradient of 27 mmHg.  This increased to 34 mmHg on echo in May 2023.  Her most recent echo on 02/14/2023 showed a further increase in the mean gradient to 52 mmHg with a dimensionless index of 0.21.  The aortic valve is trileaflet with severe calcification and thickening and restricted leaflet mobility.  There is mild aortic insufficiency.  Aortic valve area was measured at 0.69 cm by VTI.  There is severe mitral annular calcification with trivial MR and no evidence of mitral stenosis.  Left ventricular ejection fraction was 60%.  She subsequently underwent cardiac catheterization on 02/25/2023 showing mild nonobstructive coronary disease.  The mean gradient was 53 mmHg with a peak to peak gradient of 58 mmHg and valve area of 0.69 cm.  Right heart pressures were normal.  She was referred for consideration of TAVR.  Her gated cardiac CTA showed a functionally  bicuspid valve with fused right and left cusps and severe bulky calcification that extended down through the annulus at the base of the noncoronary cusp.  We discussed her case at our multidisciplinary heart valve meeting and there is concern about the risk of perivalvular leak and the further consequences in this active woman.  She has a severely calcified functionally bicuspid aortic valve with bulky calcification extending down through the annulus beneath the noncoronary cusp. Dr. Laneta Simmers thinks this combination would increase her risk of paravalvular leak and suboptimal result with TAVR. Dr. Laneta Simmers stated the best option for treating her would be open surgical aortic valve replacement. Potential risks, benefits, and complications of the surgery were discussed with the patient and she agreed to proceed with surgery.  Hospital Course: Patient underwent an aortic valve replacement (size 23 mm). She was transported from the OR to [facility] in stable condition. She was extubated early the evening of surgery. Theone Murdoch, a line, chest tubes and foley were removed early in her post op course. She was started on low dose Lopressor. She was volume overloaded and diuresed accordingly. EPW were removed on post op day one followed by chest tubes later in the day. She had expected post op blood loss anemia. She did not require a transfusion. She was transition off the Insulin drip. Her pre op HGA1C was 5.2. Accu checks and SS were stopped upon transfer from the ICU. WBC was elevated post op. Of note, she was given Solumedrol early post  op for possible allergic reaction (which was not). WBC did continue to decrease and she had no further fever. WBC was down to 11,300 on 09/06. She did require several liters of oxygen via [place]. She was weaned to 2L by 09/06. She had two bowel movements. Lopressor was increased for better BP/HR control. She was felt stable for transfer from the [facility] ICU to 4E on 09/06. She has been tolerating a  diet. All wounds are clean, dry, healing without signs of infection.  She progressed well with mobility after transfer to [facility].  She was noted to have a small to moderate right pleural effusion that persisted despite good response to diuretic.  We decided to ask the interventional radiology team she imaged this with ultrasound and drain if appropriate.  This was performed with 350 cc of fluid removed.  She tolerated this without difficulty.  Follow up CXR showed near resolution of left pleural effusion.  She remains stable in NSR.  She is ambulating without difficulty.  She is medically stable for discharge home today.  Consults: None  Significant Diagnostic Studies:  CLINICAL DATA:  [id number] Pneumothorax [id number]   EXAM: PORTABLE CHEST - 1 VIEW   COMPARISON:  the previous day's study   FINDINGS: Extubation. Mediastinal drains and right IJ venous sheath is stable. Low lung volumes with worsening bibasilar opacities left greater than right.   Heart size and mediastinal contours are within normal limits. Post AVR. Aortic Atherosclerosis (ICD10-170.0).   Blunting of bilateral costophrenic angles.   Sternotomy wires.   IMPRESSION: Extubation with worsening bibasilar opacities and small effusions.     Electronically Signed   By: Corlis Leak M.D.   On: 03/23/2023 08:24  Treatments: surgery:  Median Sternotomy Extracorporeal circulation 3.   Aortic valve replacement using a 23 mm Edwards INSPIRIS RESILIA pericardial valve by Dr. Laneta Simmers on 03/22/2023.  Discharge Exam: Performed by Dr. Laneta Simmers Blood pressure (!) 144/78, pulse 90, temperature 98.7 F (37.1 C), temperature source Oral, resp. rate 20, height 5\' 3"  (1.6 m), weight 69 kg, SpO2 94%.  General appearance: alert and cooperative Neurologic: intact Heart: regular rate and rhythm Lungs: clear to auscultation bilaterally Extremities: no edema Wound: incision healing well   Discharge Medications:  The patient has  been discharged on:   1.Beta Blocker:  Yes [ x  ]                              No   [   ]                              If No, reason:  2.Ace Inhibitor/ARB: Yes [ X  ]                                     No  [    ]                                     If No, reason:  3.Statin:   Yes [  x ]                  No  [   ]  If No, reason:  4.Ecasa:  Yes  [  x ]                  No   [   ]                  If No, reason:  Patient had ACS upon admission:No  Plavix/P2Y12 inhibitor: Yes [   ]                                      No  [ x  ]     Discharge Instructions     Amb Referral to Cardiac Rehabilitation   Complete by: As directed    Diagnosis: Valve Replacement   Valve: Aortic   After initial evaluation and assessments completed: Virtual Based Care may be provided alone or in conjunction with Phase 2 Cardiac Rehab based on patient barriers.: Yes   Intensive Cardiac Rehabilitation (ICR) [facility] location only OR Traditional Cardiac Rehabilitation (TCR) *If criteria for ICR are not met will enroll in TCR [facility] only): Yes      Allergies as of 03/28/2023   No Known Allergies      Medication List     STOP taking these medications    amLODipine 10 MG tablet Commonly known as: NORVASC       TAKE these medications    aspirin EC 325 MG tablet Take 1 tablet (325 mg total) by mouth daily.   atorvastatin 10 MG tablet Commonly known as: LIPITOR Take 10 mg by mouth daily.   benazepril 20 MG tablet Commonly known as: LOTENSIN Take 20 mg by mouth daily.   levothyroxine 112 MCG tablet Commonly known as: SYNTHROID Take 112 mcg by mouth daily before breakfast.   metoprolol tartrate 25 MG tablet Commonly known as: LOPRESSOR Take 1.5 tablets (37.5 mg total) by mouth 2 (two) times daily.   traMADol 50 MG tablet Commonly known as: ULTRAM Take 1 tablet (50 mg total) by mouth every 6 (six) hours as needed for moderate pain.        Follow-up Information      Alleen Borne, MD. Go on 04/27/2023.   Specialty: Cardiothoracic Surgery Why: Appointment is at 11:00 am Contact information: [address] [employer] [address] [place] [address] [phone]         [facility]. Go on 04/27/2023.   Why: Please arrive by 10:00 am on 10/09 in order to have a PA/LAT CXR taken PRIOR to office appointment with Dr. Cristy Hilts information: [address] [place] [address]        [facility]. Go on 04/07/2023.   Specialty: Cardiothoracic Surgery Why: Appointment is with nurse only for chest tube suture removal. Appointment time is at 11:00 am Contact information: [address] [place], [address] [place] [address] [phone]        [facility], [place]. Go on 04/12/2023.   Specialty: Cardiology Why: Appointment time is at 10:55 am Contact information: [address] [place] [address] [phone]         [facility]. Go on 05/03/2023.   Specialty: Cardiology Why: Appointment time is at 1:00 pm Contact information: [address] [place] [address] [phone]  Signed:  Lowella Dandy, PA-C  03/28/2023, 7:28 AM

## 2023-03-22 NOTE — Brief Op Note (Signed)
03/22/2023  10:11 AM  PATIENT:  Holly Patrick  77 y.o. female  PRE-OPERATIVE DIAGNOSIS:  SEVERE AORTIC VALVE STENOSIS  POST-OPERATIVE DIAGNOSIS:  SEVERE AORTIC VALVE STENOSIS  PROCEDURE:  TRANSESOPHAGEAL ECHOCARDIOGRAM, AORTIC VALVE REPLACEMENT (AVR) (USING AN INSPIRIS MODEL # [id number],  Serial # [id number], Size 23 MM)  SURGEON:  Surgeons and Role:    Alleen Borne, MD - Primary  PHYSICIAN ASSISTANT:   ASSISTANTS: Doree Fudge PA-C   ANESTHESIA:   general  EBL:  Per anesthesia and perfusion record  DRAINS:  Chest tubes placed in the mediastinal and pleural spaces    SPECIMEN:  Source of Specimen:  Native AV leaflets  DISPOSITION OF SPECIMEN:  PATHOLOGY  COUNTS CORRECT:  YES  DICTATION: .Dragon Dictation  PLAN OF CARE: Admit to inpatient   PATIENT DISPOSITION:  ICU - intubated and hemodynamically stable.   Delay start of Pharmacological VTE agent (>24hrs) due to surgical blood loss or risk of bleeding: no  BASELINE WEIGHT: 70 kg

## 2023-03-22 NOTE — Transfer of Care (Signed)
Immediate Anesthesia Transfer of Care Note  Patient: SAJADA BRODIGAN  Procedure(s) Performed: AORTIC VALVE REPLACEMENT (AVR) USING A INSPIRIS RESILIA  VALVE. (Chest) TRANSESOPHAGEAL ECHOCARDIOGRAM  Patient Location: PACU  Anesthesia Type:General  Level of Consciousness: Patient remains intubated per anesthesia plan  Airway & Oxygen Therapy: Patient remains intubated per anesthesia plan  Post-op Assessment: Report given to RN and Post -op Vital signs reviewed and stable  Post vital signs: Reviewed and stable  Last Vitals:  Vitals Value Taken Time  BP 89/54 03/22/23 1143  Temp 36.3 C 03/22/23 1144  Pulse 80 03/22/23 1144  Resp 16 03/22/23 1144  SpO2 97 % 03/22/23 1144  Vitals shown include unfiled device data.  Last Pain:  Vitals:   03/22/23 0541  TempSrc: Oral         Complications: There were no known notable events for this encounter.

## 2023-03-22 NOTE — Op Note (Signed)
CARDIOVASCULAR SURGERY OPERATIVE NOTE  03/22/2023 Holly Patrick [id number]  Surgeon:  Alleen Borne, MD  First Assistant: Doree Fudge,  PA-C : An experienced assistant was required given the complexity of this surgery and the standard of surgical care. The assistant was needed for exposure, dissection, suctioning, retraction of delicate tissues and sutures, instrument exchange and for overall help during this procedure.    Preoperative Diagnosis:  Severe aortic stenosis   Postoperative Diagnosis:  Same   Procedure:  Median Sternotomy Extracorporeal circulation 3.   Aortic valve replacement using a 23 mm Edwards INSPIRIS RESILIA pericardial valve.  Anesthesia:  General Endotracheal   Clinical History/Surgical Indication:  This 77 year old active woman has stage D, severe, symptomatic aortic stenosis with NYHA class II symptoms of exertional fatigue and tiredness consistent with chronic diastolic congestive heart failure. She has a severely calcified functionally bicuspid aortic valve with bulky calcification extending down through the annulus beneath the noncoronary cusp. I think this combination would increase her risk of paravalvular leak and suboptimal result with TAVR. I think the best option for treating her would be open surgical aortic valve replacement to allow removal of her severely calcified bicuspid valve. Cardiac catheterization shows mild nonobstructive coronary disease. I discussed the alternatives of TAVR and open surgical aortic valve replacement with her and her son and daughter. We discussed the benefits and risks of both. She seems understand and agrees to proceed with open aortic valve replacement. I discussed the operative procedure with the patient and family including alternatives, benefits and risks; including but not limited to bleeding, blood transfusion, infection, stroke, myocardial infarction, graft failure, heart block requiring a permanent pacemaker,  organ dysfunction, and death. Holly Patrick understands and agrees to proceed.   Preparation:  The patient was seen in the preoperative holding area and the correct patient, correct operation were confirmed with the patient after reviewing the medical record and catheterization. The consent was signed by me. Preoperative antibiotics were given. A pulmonary arterial line and radial arterial line were placed by the anesthesia team. The patient was taken back to the operating room and positioned supine on the operating room table. After being placed under general endotracheal anesthesia by the anesthesia team a foley catheter was placed. The neck, chest, abdomen, and both legs were prepped with betadine soap and solution and draped in the usual sterile manner. A surgical time-out was taken and the correct patient and operative procedure were confirmed with the nursing and anesthesia staff.   Pre-bypass TEE:   Complete TEE assessment was performed by Dr. Verdia Kuba. This showed a severely calcified bicuspid aortic valve with a mean gradient of 40 mm Hg. Mild AI. Mild MR. Normal LV systolic function    Post-bypass TEE:   Normal functioning prosthetic aortic valve with no perivalvular leak or regurgitation through the valve. Left ventricular function preserved. Unchanged mild mitral regurgitation.    Cardiopulmonary Bypass:  A median sternotomy was performed. The pericardium was opened in the midline. Right ventricular function appeared normal. The ascending aorta was of normal size and had no palpable plaque. There were no contraindications to aortic cannulation or cross-clamping. The patient was fully systemically heparinized and the ACT was maintained > 400 sec. The proximal aortic arch was cannulated with a 20 F aortic cannula for arterial inflow. Venous cannulation was performed via the right atrial appendage using a two-staged venous cannula. An antegrade cardioplegia/vent cannula was inserted  into the mid-ascending aorta. A left ventricular vent was placed via the  right superior pulmonary vein. A retrograde cardioplegia cannnula was placed into the coronary sinus via the right atrium. Aortic occlusion was performed with a single cross-clamp. Systemic cooling to 32 degrees Centigrade and topical cooling of the heart with iced saline were used. Cold antegrade KBC cardioplegia was used to induce diastolic arrest. A temperature probe was inserted into the interventricular septum and an insulating pad was placed in the pericardium. Carbon dioxide was insufflated into the pericardium at 5L/min throughout the procedure to minimize intracardiac air.   Aortic Valve Replacement: Assisted by Doree Fudge, PA-C  A transverse aortotomy was performed 1 cm above the take-off of the right coronary artery. The native valve was a type 1 functionally bicuspid valve with severely calcified leaflets and mild annular calcification. The ostia of the coronary arteries were in normal position and were not obstructed. The native valve leaflets were excised and the annulus was decalcified with rongeurs. Care was taken to remove all particulate debris. The left ventricle was directly inspected for debris and then irrigated with ice saline solution. The annulus was sized and a size 23 mm Edwards INSPIRIS RESILIA pericardial valve was chosen. The model number was [id number] and the serial number was [id number]. While the valve was being prepared 2-0 Ethibond pledgeted horizontal mattress sutures were placed around the annulus with the pledgets in a sub-annular position. The sutures were placed through the sewing ring and the valve lowered into place. The sutures were tied using the CorKnot system. The valve seated nicely and the coronary ostia were not obstructed. The prosthetic valve leaflets moved normally and there was no sub-valvular obstruction. The aortotomy was closed using 4-0 Prolene suture in 2 layers with felt strips  to reinforce the closure.  Completion:  The patient was rewarmed to 37 degrees Centigrade. De-airing maneuvers were performed and the head placed in trendelenburg position. The crossclamp was removed with a time of 69 minutes. There was spontaneous return of sinus rhythm. The aortotomy was checked for hemostasis. Two temporary epicardial pacing wires were placed on the right atrium and two on the right ventricle. The left ventricular vent and retrograde cardioplegia cannulas were removed. The patient was weaned from CPB without difficulty on no inotropes. CPB time was 89 minutes. Cardiac output was 5 LPM. Heparin was fully reversed with protamine and the aortic and venous cannulas removed. Hemostasis was achieved. Mediastinal drainage tubes were placed. The sternum was closed with  #6 stainless steel wires. The fascia was closed with continuous # 1 vicryl suture. The subcutaneous tissue was closed with 2-0 vicryl continuous suture. The skin was closed with 3-0 vicryl subcuticular suture. All sponge, needle, and instrument counts were reported correct at the end of the case. Dry sterile dressings were placed over the incisions and around the chest tubes which were connected to pleurevac suction. The patient was then transported to the surgical intensive care unit in stable condition.

## 2023-03-22 NOTE — Anesthesia Procedure Notes (Signed)
Central Venous Catheter Insertion Performed by: Achille Rich, MD, anesthesiologist Start/End9/09/2022 6:58 AM, 03/22/2023 7:00 AM Patient location: Pre-op. Preanesthetic checklist: patient identified, IV checked, site marked, risks and benefits discussed, surgical consent, monitors and equipment checked, pre-op evaluation, timeout performed and anesthesia consent Hand hygiene performed  and maximum sterile barriers used  PA cath was placed.Swan type:thermodilution Procedure performed without using ultrasound guided technique. Attempts: 1 Patient tolerated the procedure well with no immediate complications.

## 2023-03-22 NOTE — Anesthesia Postprocedure Evaluation (Signed)
Anesthesia Post Note  Patient: Holly Patrick  Procedure(s) Performed: AORTIC VALVE REPLACEMENT (AVR) USING A INSPIRIS RESILIA  VALVE. (Chest) TRANSESOPHAGEAL ECHOCARDIOGRAM     Patient location during evaluation: SICU Anesthesia Type: General Level of consciousness: sedated Pain management: pain level controlled Vital Signs Assessment: post-procedure vital signs reviewed and stable Respiratory status: patient remains intubated per anesthesia plan Cardiovascular status: stable Postop Assessment: no apparent nausea or vomiting Anesthetic complications: no   There were no known notable events for this encounter.  Last Vitals:  Vitals:   03/22/23 1250 03/22/23 1305  BP:    Pulse: 60 80  Resp: (!) 21 (!) 6  Temp: (!) 35.9 C (!) 35.8 C  SpO2: 99% 98%    Last Pain:  Vitals:   03/22/23 0541  TempSrc: Oral                 [place]

## 2023-03-22 NOTE — Interval H&P Note (Signed)
History and Physical Interval Note:  03/22/2023 6:19 AM  Holly Patrick  has presented today for surgery, with the diagnosis of SEVERE AS.  The various methods of treatment have been discussed with the patient and family. After consideration of risks, benefits and other options for treatment, the patient has consented to  Procedure(s): AORTIC VALVE REPLACEMENT (AVR) (N/A) TRANSESOPHAGEAL ECHOCARDIOGRAM (N/A) as a surgical intervention.  The patient's history has been reviewed, patient examined, no change in status, stable for surgery.  I have reviewed the patient's chart and labs.  Questions were answered to the patient's satisfaction.     Alleen Borne

## 2023-03-22 NOTE — Progress Notes (Signed)
EVENING ROUNDS NOTE :     [address]       [name] [address]             [phone]                 Day of Surgery Procedure(s) (LRB): AORTIC VALVE REPLACEMENT (AVR) USING A INSPIRIS RESILIA  VALVE. (N/A) TRANSESOPHAGEAL ECHOCARDIOGRAM (N/A)   Total Length of Stay:  LOS: 0 days  Events:   Doing well Minimal CT output extubated    BP 104/63   Pulse 89   Temp 97.9 F (36.6 C) (Oral) Comment (Src): temp foley reading incorrect, verified with oral temperature  Resp 19   Ht 5\' 3"  (1.6 m)   Wt 70.3 kg   SpO2 94%   BMI 27.46 kg/m   PAP: (22-29)/(-7-12) 29/12  Vent Mode: PSV;CPAP FiO2 (%):  [40 %-50 %] 40 % Set Rate:  [4 bmp-16 bmp] 4 bmp Vt Set:  [420 mL] 420 mL PEEP:  [5 cmH20] 5 cmH20 Pressure Support:  [10 cmH20] 10 cmH20 Plateau Pressure:  [16 cmH20] 16 cmH20   sodium chloride 20 mL/hr at 03/22/23 1700   [START ON 03/23/2023] sodium chloride     sodium chloride     albumin human      ceFAZolin (ANCEF) IV Stopped (03/22/23 1321)   dexmedetomidine (PRECEDEX) IV infusion Stopped (03/22/23 1412)   insulin 1 Units/hr (03/22/23 1700)   lactated ringers 20 mL/hr at 03/22/23 1700   lactated ringers 20 mL/hr at 03/22/23 1700   niCARDipine Stopped (03/22/23 1406)   norepinephrine (LEVOPHED) Adult infusion Stopped (03/22/23 1530)   phenylephrine (NEO-SYNEPHRINE) Adult infusion Stopped (03/22/23 1512)   vancomycin      No intake/output data recorded.      Latest Ref Rng & Units 03/22/2023    5:56 PM 03/22/2023    4:46 PM 03/22/2023    4:29 PM  CBC  Hemoglobin 12.0 - 15.0 g/dL 9.2  8.8  9.2   Hematocrit 36.0 - 46.0 % 27.0  26.0  27.0        Latest Ref Rng & Units 03/22/2023    5:56 PM 03/22/2023    4:46 PM 03/22/2023    4:29 PM  BMP  Sodium 135 - 145 mmol/L 144  144  144   Potassium 3.5 - 5.1 mmol/L 3.6  3.8  3.9     ABG    Component Value Date/Time   PHART 7.360 03/22/2023 1756   PCO2ART 39.8 03/22/2023 1756   PO2ART 66 (L) 03/22/2023  1756   HCO3 22.6 03/22/2023 1756   TCO2 24 03/22/2023 1756   ACIDBASEDEF 3.0 (H) 03/22/2023 1756   O2SAT 92 03/22/2023 1756       Brynda Greathouse, MD 03/22/2023 6:15 PM

## 2023-03-22 NOTE — Procedures (Signed)
Extubation Procedure Note  Patient Details:   Name: Holly Patrick DOB: Jun 22, 1946 MRN: [id number]   Airway Documentation:    Vent end date: (not recorded) Vent end time: (not recorded)   Evaluation  O2 sats: stable throughout Complications: No apparent complications Patient did tolerate procedure well. Bilateral Breath Sounds: Clear   Yes  Dewain Penning T 03/22/2023, 5:03 PM

## 2023-03-22 NOTE — Anesthesia Procedure Notes (Signed)
Procedure Name: Intubation Date/Time: 03/22/2023 7:39 AM  Performed by: Owens Loffler, RNPre-anesthesia Checklist: Patient identified, Emergency Drugs available, Suction available and Patient being monitored Patient Re-evaluated:Patient Re-evaluated prior to induction Oxygen Delivery Method: Circle system utilized Preoxygenation: Pre-oxygenation with 100% oxygen Induction Type: IV induction Ventilation: Mask ventilation without difficulty Laryngoscope Size: Mac and 3 Grade View: Grade I Tube type: Oral Tube size: 8.0 mm Number of attempts: 1 Airway Equipment and Method: Stylet Placement Confirmation: ETT inserted through vocal cords under direct vision, positive ETCO2 and breath sounds checked- equal and bilateral Secured at: 23 cm Tube secured with: Tape Dental Injury: Teeth and Oropharynx as per pre-operative assessment

## 2023-03-22 NOTE — Plan of Care (Signed)
  Problem: Education: Goal: Understanding of CV disease, CV risk reduction, and recovery process will improve Outcome: Progressing Goal: Individualized Educational Video(s) Outcome: Progressing   Problem: Activity: Goal: Ability to return to baseline activity level will improve Outcome: Progressing   Problem: Cardiovascular: Goal: Ability to achieve and maintain adequate cardiovascular perfusion will improve Outcome: Progressing Goal: Vascular access site(s) Level 0-1 will be maintained Outcome: Progressing   Problem: Health Behavior/Discharge Planning: Goal: Ability to safely manage health-related needs after discharge will improve Outcome: Progressing   Problem: Education: Goal: Knowledge of General Education information will improve Description: Including pain rating scale, medication(s)/side effects and non-pharmacologic comfort measures Outcome: Progressing   Problem: Health Behavior/Discharge Planning: Goal: Ability to manage health-related needs will improve Outcome: Progressing   Problem: Clinical Measurements: Goal: Ability to maintain clinical measurements within normal limits will improve Outcome: Progressing Goal: Will remain free from infection Outcome: Progressing Goal: Diagnostic test results will improve Outcome: Progressing Goal: Respiratory complications will improve Outcome: Progressing Goal: Cardiovascular complication will be avoided Outcome: Progressing   Problem: Activity: Goal: Risk for activity intolerance will decrease Outcome: Progressing   Problem: Nutrition: Goal: Adequate nutrition will be maintained Outcome: Progressing   Problem: Coping: Goal: Level of anxiety will decrease Outcome: Progressing   Problem: Elimination: Goal: Will not experience complications related to bowel motility Outcome: Progressing Goal: Will not experience complications related to urinary retention Outcome: Progressing   Problem: Pain Managment: Goal:  General experience of comfort will improve Outcome: Progressing   Problem: Safety: Goal: Ability to remain free from injury will improve Outcome: Progressing   Problem: Skin Integrity: Goal: Risk for impaired skin integrity will decrease Outcome: Progressing   Problem: Education: Goal: Will demonstrate proper wound care and an understanding of methods to prevent future damage Outcome: Progressing Goal: Knowledge of disease or condition will improve Outcome: Progressing Goal: Knowledge of the prescribed therapeutic regimen will improve Outcome: Progressing Goal: Individualized Educational Video(s) Outcome: Progressing   Problem: Activity: Goal: Risk for activity intolerance will decrease Outcome: Progressing   Problem: Cardiac: Goal: Will achieve and/or maintain hemodynamic stability Outcome: Progressing   Problem: Clinical Measurements: Goal: Postoperative complications will be avoided or minimized Outcome: Progressing   Problem: Respiratory: Goal: Respiratory status will improve Outcome: Progressing   Problem: Skin Integrity: Goal: Wound healing without signs and symptoms of infection Outcome: Progressing Goal: Risk for impaired skin integrity will decrease Outcome: Progressing   Problem: Urinary Elimination: Goal: Ability to achieve and maintain adequate renal perfusion and functioning will improve Outcome: Progressing   

## 2023-03-22 NOTE — Progress Notes (Signed)
  Echocardiogram Echocardiogram Transesophageal has been performed.  Janalyn Harder 03/22/2023, 8:46 AM

## 2023-03-22 NOTE — Hospital Course (Addendum)
HPI: This is a 77 year old active woman with hypertension, hypothyroidism, OSA no longer on CPAP, and aortic stenosis that has been followed by Dr. Eden Emms.  An echocardiogram in May 2022 showed a mean gradient of 27 mmHg.  This increased to 34 mmHg on echo in May 2023.  Her most recent echo on 02/14/2023 showed a further increase in the mean gradient to 52 mmHg with a dimensionless index of 0.21.  The aortic valve is trileaflet with severe calcification and thickening and restricted leaflet mobility.  There is mild aortic insufficiency.  Aortic valve area was measured at 0.69 cm by VTI.  There is severe mitral annular calcification with trivial MR and no evidence of mitral stenosis.  Left ventricular ejection fraction was 60%.  She subsequently underwent cardiac catheterization on 02/25/2023 showing mild nonobstructive coronary disease.  The mean gradient was 53 mmHg with a peak to peak gradient of 58 mmHg and valve area of 0.69 cm.  Right heart pressures were normal.  She was referred for consideration of TAVR.  Her gated cardiac CTA showed a functionally bicuspid valve with fused right and left cusps and severe bulky calcification that extended down through the annulus at the base of the noncoronary cusp.  We discussed her case at our multidisciplinary heart valve meeting and there is concern about the risk of perivalvular leak and the further consequences in this active woman.  She has a severely calcified functionally bicuspid aortic valve with bulky calcification extending down through the annulus beneath the noncoronary cusp. Dr. Laneta Simmers thinks this combination would increase her risk of paravalvular leak and suboptimal result with TAVR. Dr. Laneta Simmers stated the best option for treating her would be open surgical aortic valve replacement. Potential risks, benefits, and complications of the surgery were discussed with the patient and she agreed to proceed with surgery.  Hospital Course: Patient underwent an  aortic valve replacement (size 23 mm). She was transported from the OR to [facility] ICU in stable condition. She was extubated early the evening of surgery. Theone Murdoch, a line, chest tubes and foley were removed early in her post op course. She was started on low dose Lopressor. She was volume overloaded and diuresed accordingly. EPW were removed on post op day one followed by chest tubes later in the day. She had expected post op blood loss anemia. She did not require a transfusion. She was transition off the Insulin drip. Her pre op HGA1C was 5.2. Accu checks and SS were stopped upon transfer from the ICU. WBC was elevated post op. Of note, she was given Solumedrol early post op for possible allergic reaction (which was not). WBC did continue to decrease and she had no further fever. WBC was down to 11,300 on 09/06. She did require several liters of oxygen via [place]. She was weaned to 2L by 09/06. She had two bowel movements. Lopressor was increased for better BP/HR control. She was felt stable for transfer from the [facility] ICU to 4E on 09/06. She has been tolerating a diet. All wounds are clean, dry, healing without signs of infection.  She progressed well with mobility after transfer to [facility].  She was noted to have a small to moderate right pleural effusion that persisted despite good response to diuretic.  We decided to ask the interventional radiology team she imaged this with ultrasound and drain if appropriate.

## 2023-03-22 NOTE — Discharge Instructions (Signed)
Discharge Instructions:  1. You may shower, please wash incisions daily with soap and water and keep dry.  If you wish to cover wounds with dressing you may do so but please keep clean and change daily.  No tub baths or swimming until incisions have completely healed.  If your incisions become red or develop any drainage please call our office at [phone]  2. No Driving until cleared by Dr. Bartle's office and you are no longer using narcotic pain medications  3. Monitor your weight daily.. Please use the same scale and weigh at same time... If you gain 5-10 lbs in 48 hours with associated lower extremity swelling, please contact our office at [phone]  4. Fever of 101.5 for at least 24 hours with no source, please contact our office at [phone]  5. Activity- up as tolerated, please walk at least 3 times per day.  Avoid strenuous activity, no lifting, pushing, or pulling with your arms over 8-10 lbs for a minimum of 6 weeks  6. If any questions or concerns arise, please do not hesitate to contact our office at [phone]  

## 2023-03-22 NOTE — Anesthesia Procedure Notes (Signed)
Arterial Line Insertion Start/End9/09/2022 6:50 AM Performed by: Garfield Cornea, CRNA, CRNA  Patient location: Pre-op. Preanesthetic checklist: patient identified, IV checked, site marked, risks and benefits discussed, surgical consent, monitors and equipment checked, pre-op evaluation, timeout performed and anesthesia consent Lidocaine 1% used for infiltration Right, radial was placed Catheter size: 20 G Hand hygiene performed  and maximum sterile barriers used   Attempts: 1 Procedure performed without using ultrasound guided technique. Following insertion, dressing applied and Biopatch. Post procedure assessment: normal  Patient tolerated the procedure well with no immediate complications.

## 2023-03-22 NOTE — Anesthesia Procedure Notes (Signed)
Central Venous Catheter Insertion Performed by: Achille Rich, MD, anesthesiologist Start/End9/09/2022 6:47 AM, 03/22/2023 6:58 AM Patient location: Pre-op. Preanesthetic checklist: patient identified, IV checked, site marked, risks and benefits discussed, surgical consent, monitors and equipment checked, pre-op evaluation, timeout performed and anesthesia consent Lidocaine 1% used for infiltration and patient sedated Hand hygiene performed  and maximum sterile barriers used  Catheter size: 8.5 Fr Sheath introducer Procedure performed using ultrasound guided technique. Ultrasound Notes:anatomy identified, needle tip was noted to be adjacent to the nerve/plexus identified, no ultrasound evidence of intravascular and/or intraneural injection and image(s) printed for medical record Attempts: 1 Following insertion, line sutured and dressing applied. Post procedure assessment: blood return through all ports, free fluid flow and no air  Patient tolerated the procedure well with no immediate complications.

## 2023-03-23 ENCOUNTER — Inpatient Hospital Stay (HOSPITAL_COMMUNITY): Payer: Medicare Other

## 2023-03-23 ENCOUNTER — Other Ambulatory Visit: Payer: Self-pay | Admitting: Physician Assistant

## 2023-03-23 ENCOUNTER — Encounter (HOSPITAL_COMMUNITY): Payer: Self-pay | Admitting: Surgery

## 2023-03-23 DIAGNOSIS — Z952 Presence of prosthetic heart valve: Secondary | ICD-10-CM

## 2023-03-23 DIAGNOSIS — I359 Nonrheumatic aortic valve disorder, unspecified: Secondary | ICD-10-CM

## 2023-03-23 LAB — GLUCOSE, CAPILLARY
Glucose-Capillary: 133 mg/dL — ABNORMAL HIGH (ref 70–99)
Glucose-Capillary: 121 mg/dL — ABNORMAL HIGH (ref 70–99)
Glucose-Capillary: 112 mg/dL — ABNORMAL HIGH (ref 70–99)
Glucose-Capillary: 115 mg/dL — ABNORMAL HIGH (ref 70–99)
Glucose-Capillary: 135 mg/dL — ABNORMAL HIGH (ref 70–99)
Glucose-Capillary: 130 mg/dL — ABNORMAL HIGH (ref 70–99)
Glucose-Capillary: 126 mg/dL — ABNORMAL HIGH (ref 70–99)
Glucose-Capillary: 130 mg/dL — ABNORMAL HIGH (ref 70–99)

## 2023-03-23 LAB — CBC
HCT: 28.6 % — ABNORMAL LOW (ref 36.0–46.0)
HCT: 29.2 % — ABNORMAL LOW (ref 36.0–46.0)
Hemoglobin: 9.6 g/dL — ABNORMAL LOW (ref 12.0–15.0)
Hemoglobin: 9.8 g/dL — ABNORMAL LOW (ref 12.0–15.0)
MCH: 30.4 pg (ref 26.0–34.0)
MCH: 31.7 pg (ref 26.0–34.0)
MCHC: 33.6 g/dL (ref 30.0–36.0)
MCHC: 33.6 g/dL (ref 30.0–36.0)
MCV: 90.5 fL (ref 80.0–100.0)
MCV: 94.5 fL (ref 80.0–100.0)
Platelets: 144 10*3/uL — ABNORMAL LOW (ref 150–400)
Platelets: 160 10*3/uL (ref 150–400)
RBC: 3.16 MIL/uL — ABNORMAL LOW (ref 3.87–5.11)
RBC: 3.09 MIL/uL — ABNORMAL LOW (ref 3.87–5.11)
RDW: 13.5 % (ref 11.5–15.5)
RDW: 14.1 % (ref 11.5–15.5)
WBC: 16 10*3/uL — ABNORMAL HIGH (ref 4.0–10.5)
WBC: 22 10*3/uL — ABNORMAL HIGH (ref 4.0–10.5)
nRBC: 0 % (ref 0.0–0.2)
nRBC: 0 % (ref 0.0–0.2)

## 2023-03-23 LAB — BASIC METABOLIC PANEL
Anion gap: 11 (ref 5–15)
Anion gap: 7 (ref 5–15)
BUN: 15 mg/dL (ref 8–23)
BUN: 17 mg/dL (ref 8–23)
CO2: 20 mmol/L — ABNORMAL LOW (ref 22–32)
CO2: 23 mmol/L (ref 22–32)
Calcium: 8.1 mg/dL — ABNORMAL LOW (ref 8.9–10.3)
Calcium: 8.3 mg/dL — ABNORMAL LOW (ref 8.9–10.3)
Chloride: 108 mmol/L (ref 98–111)
Chloride: 104 mmol/L (ref 98–111)
Creatinine, Ser: 0.66 mg/dL (ref 0.44–1.00)
Creatinine, Ser: 0.75 mg/dL (ref 0.44–1.00)
GFR, Estimated: >60 mL/min (ref >60)
GFR, Estimated: >60 mL/min (ref >60)
Glucose, Bld: 118 mg/dL — ABNORMAL HIGH (ref 70–99)
Glucose, Bld: 126 mg/dL — ABNORMAL HIGH (ref 70–99)
Potassium: 4.3 mmol/L (ref 3.5–5.1)
Potassium: 4.5 mmol/L (ref 3.5–5.1)
Sodium: 139 mmol/L (ref 135–145)
Sodium: 134 mmol/L — ABNORMAL LOW (ref 135–145)

## 2023-03-23 LAB — MAGNESIUM
Magnesium: 2.1 mg/dL (ref 1.7–2.4)
Magnesium: 2.1 mg/dL (ref 1.7–2.4)

## 2023-03-23 MED ORDER — INSULIN ASPART 100 UNIT/ML IJ SOLN: 0.0000 [IU] | INTRAMUSCULAR | Status: DC

## 2023-03-23 MED ORDER — INSULIN ASPART 100 UNIT/ML IJ SOLN
0.0000 [IU] | Freq: Three times a day (TID) | INTRAMUSCULAR | Status: DC
  Administered 2023-03-23 (×3): 2 [IU] via SUBCUTANEOUS

## 2023-03-23 MED ORDER — FUROSEMIDE 10 MG/ML IJ SOLN
40.0000 mg | Freq: Two times a day (BID) | INTRAMUSCULAR | Status: AC
  Administered 2023-03-24 (×2): 40 mg via INTRAVENOUS
  Filled 2023-03-23 (×2): qty 4

## 2023-03-23 MED ORDER — FUROSEMIDE 10 MG/ML IJ SOLN
40.0000 mg | Freq: Two times a day (BID) | INTRAMUSCULAR | Status: AC
  Administered 2023-03-23 (×2): 40 mg via INTRAVENOUS
  Filled 2023-03-23 (×2): qty 4

## 2023-03-23 MED ORDER — POTASSIUM CHLORIDE CRYS ER 20 MEQ PO TBCR
20.0000 meq | EXTENDED_RELEASE_TABLET | Freq: Two times a day (BID) | ORAL | Status: AC
  Administered 2023-03-23 (×2): 20 meq via ORAL
  Filled 2023-03-23 (×2): qty 1

## 2023-03-23 MED ORDER — POTASSIUM CHLORIDE CRYS ER 20 MEQ PO TBCR
20.0000 meq | EXTENDED_RELEASE_TABLET | Freq: Two times a day (BID) | ORAL | Status: AC
  Administered 2023-03-24 (×2): 20 meq via ORAL
  Filled 2023-03-23 (×2): qty 1

## 2023-03-23 MED FILL — Gelatin Absorbable MT Powder: OROMUCOSAL | Qty: 1 | Status: AC

## 2023-03-23 MED FILL — Thrombin For Soln 20000 Unit: CUTANEOUS | Qty: 1 | Status: AC

## 2023-03-23 NOTE — Progress Notes (Signed)
1 Day Post-Op Procedure(s) (LRB): AORTIC VALVE REPLACEMENT (AVR) USING A INSPIRIS RESILIA  VALVE. (N/A) TRANSESOPHAGEAL ECHOCARDIOGRAM (N/A) Subjective: No complaints, up in chair, smiling.  Objective: Vital signs in last 24 hours: Temp:  [95.9 F (35.5 C)-98 F (36.7 C)] 98 F (36.7 C) (09/04 0000) Pulse Rate:  [40-96] 73 (09/04 0700) Cardiac Rhythm: Normal sinus rhythm (09/04 0400) Resp:  [6-28] 20 (09/04 0700) BP: (69-154)/(44-111) 121/62 (09/04 0700) SpO2:  [87 %-99 %] 92 % (09/04 0700) Arterial Line BP: (46-181)/(22-64) 152/45 (09/04 0700) FiO2 (%):  [40 %-50 %] 40 % (09/03 1556) Weight:  [78.3 kg] 78.3 kg (09/04 0500)  Hemodynamic parameters for last 24 hours: PAP: (23-29)/(8-12) 29/12 CVP:  [9 mmHg-15 mmHg] 9 mmHg  Intake/Output from previous day: 09/03 0701 - 09/04 0700 In: 7562.8 [P.O.:120; I.V.:3894.1; Blood:680; IV Piggyback:2868.7] Out: 3590 [Urine:1825; Blood:1360; Chest Tube:405] Intake/Output this shift: No intake/output data recorded.  General appearance: alert and cooperative Neurologic: intact Heart: regular rate and rhythm, S1, S2 normal, no murmur Lungs: clear to auscultation bilaterally Extremities: edema moderate Wound: dressing dry  Lab Results: Recent Labs    03/22/23 1754 03/22/23 1756 03/23/23 0442  WBC 13.8*  --  16.0*  HGB 9.2* 9.2* 9.6*  HCT 27.7* 27.0* 28.6*  PLT 142*  --  144*   BMET:  Recent Labs    03/22/23 1754 03/22/23 1756 03/23/23 0442  NA 138 144 139  K 3.5 3.6 4.3  CL 108  --  108  CO2 21*  --  20*  GLUCOSE 155*  --  118*  BUN 15  --  15  CREATININE 0.71  --  0.66  CALCIUM 7.5*  --  8.1*    PT/INR:  Recent Labs    03/22/23 1147  LABPROT 17.6*  INR 1.4*   ABG    Component Value Date/Time   PHART 7.360 03/22/2023 1756   HCO3 22.6 03/22/2023 1756   TCO2 24 03/22/2023 1756   ACIDBASEDEF 3.0 (H) 03/22/2023 1756   O2SAT 92 03/22/2023 1756   CBG (last 3)  Recent Labs    03/22/23 2310  03/23/23 0136 03/23/23 0302  GLUCAP 133* 121* 112*   CXR: bibasilar atelectasis  ECG: sinus, no acute changes  Assessment/Plan: S/P Procedure(s) (LRB): AORTIC VALVE REPLACEMENT (AVR) USING A INSPIRIS RESILIA  VALVE. (N/A) TRANSESOPHAGEAL ECHOCARDIOGRAM (N/A)  POD 1 Hemodynamically stable in sinus rhythm. Continue low dose Lopressor.  Volume excess: start diuresis. Wt is 17.5 lbs over preop.  Glucose under good control with no hx of DM and normal Hgb A1c. Continue SSI today and can stop later if glucose remains under control. She received 60 mg solumedrol early postop for possible allergic reaction which wasn't.   DC pacing wires and then chest tubes 2 hrs later if stable.  DC arterial line.  IS, OOB, mobilize.   LOS: 1 day    Holly Patrick 03/23/2023

## 2023-03-23 NOTE — Progress Notes (Signed)
      [address]       [place] [address]             [phone]      POD # 1 AVR  Visiting with family  BP (!) 148/64   Pulse 83   Temp 98.7 F (37.1 C) (Oral)   Resp (!) 23   Ht 5\' 3"  (1.6 m)   Wt 78.3 kg   SpO2 91%   BMI 30.58 kg/m   Intake/Output Summary (Last 24 hours) at 03/23/2023 1808 Last data filed at 03/23/2023 1700 Gross per 24 hour  Intake 1107.65 ml  Output 2270 ml  Net -1162.35 ml   WBC 22k (received solumedrol) Hct 29  Doing well POD # 1  Deloris Mittag C. Dorris Fetch, MD [facility] [phone]

## 2023-03-23 NOTE — Progress Notes (Signed)
Post op AVR echo per [facility] protocol, primary cardiologist Dr. Eden Emms, surgeon Dr. Laneta Simmers

## 2023-03-23 NOTE — TOC Initial Note (Signed)
Transition of Care [facility]) - Initial/Assessment Note    Patient Details  Name: Holly Patrick MRN: [id number] Date of Birth: February 04, 1946  Transition of [facility]) CM/SW Contact:    Elliot Cousin, RN Phone Number: [phone] 03/23/2023, 4:13 PM  Clinical Narrative:   Spoke to pt and states her dtr and son lives near to assist as needed. States she has DME in the home. Pt agreeable on HH. Will follow up with [facility] for prearranged surgery HH orders.               Expected Discharge Plan: Home w Home Health Services Barriers to Discharge: Continued Medical Work up   Patient Goals and CMS Choice Patient states their goals for this hospitalization and ongoing recovery are:: wants to continue to get better   Choice offered to / list presented to : Patient      Expected Discharge Plan and Services   Discharge Planning Services: CM Consult Post Acute Care Choice: Home Health Living arrangements for the past 2 months: Single Family Home                                      Prior Living Arrangements/Services Living arrangements for the past 2 months: Single Family Home Lives with:: Self          Need for Family Participation in Patient Care: No (Comment) Care giver support system in place?: Yes (comment) Current home services: DME (rolling walker, cane) Criminal Activity/Legal Involvement Pertinent to Current Situation/Hospitalization: No - Comment as needed  Activities of Daily Living Home Assistive Devices/Equipment: None ADL Screening (condition at time of admission) Patient's cognitive ability adequate to safely complete daily activities?: Yes Is the patient deaf or have difficulty hearing?: No Does the patient have difficulty seeing, even when wearing glasses/contacts?: No Does the patient have difficulty concentrating, remembering, or making decisions?: No Patient able to express need for assistance with ADLs?: Yes Does the patient have difficulty  dressing or bathing?: No Independently performs ADLs?: Yes (appropriate for developmental age) Does the patient have difficulty walking or climbing stairs?: No Weakness of Legs: None Weakness of Arms/Hands: None  Permission Sought/Granted Permission sought to share information with : Case Manager, Family Supports, PCP Permission granted to share information with : Yes, Verbal Permission Granted  Share Information with NAME: Star Age  Permission granted to share info w AGENCY: Home Health  Permission granted to share info w Relationship: daughter  Permission granted to share info w Contact Information: [phone]  Emotional Assessment Appearance:: Appears stated age Attitude/Demeanor/Rapport: Engaged Affect (typically observed): Accepting Orientation: : Oriented to Self, Oriented to Place, Oriented to  Time, Oriented to Situation   Psych Involvement: No (comment)  Admission diagnosis:  Aortic stenosis, severe [I35.0] Patient Active Problem List   Diagnosis Date Noted   S/P aortic valve replacement with bioprosthetic valve 03/22/2023   Aortic stenosis, severe 03/22/2023   Status post total hip replacement, right 11/28/2020   Murmur 10/09/2012   HTN (hypertension) 10/09/2012   PCP:  Lonie Peak, PA-C Pharmacy:   [facility] [phone] - [place], [place] - [address] AT [facility] [address] [place] [address] Phone: [phone] Fax: [phone]  [facility] - [place], [place] - [address] [place] [address] Phone: [phone] Fax: [phone]     Social Determinants of Health (SDOH) Social History: SDOH  Screenings   Food Insecurity: No Food Insecurity (03/22/2023)  Housing: Low Risk  (03/22/2023)  Transportation Needs: No Transportation Needs (03/22/2023)  Utilities: Not At Risk (03/22/2023)  Tobacco Use: Low Risk  (03/22/2023)   SDOH Interventions:     Readmission Risk Interventions     No  data to display

## 2023-03-24 ENCOUNTER — Inpatient Hospital Stay (HOSPITAL_COMMUNITY): Payer: Medicare Other

## 2023-03-24 LAB — BASIC METABOLIC PANEL
Anion gap: 7 (ref 5–15)
BUN: 16 mg/dL (ref 8–23)
CO2: 24 mmol/L (ref 22–32)
Calcium: 8.3 mg/dL — ABNORMAL LOW (ref 8.9–10.3)
Chloride: 103 mmol/L (ref 98–111)
Creatinine, Ser: 0.83 mg/dL (ref 0.44–1.00)
GFR, Estimated: >60 mL/min (ref >60)
Glucose, Bld: 113 mg/dL — ABNORMAL HIGH (ref 70–99)
Potassium: 4.7 mmol/L (ref 3.5–5.1)
Sodium: 134 mmol/L — ABNORMAL LOW (ref 135–145)

## 2023-03-24 LAB — CBC
HCT: 28.6 % — ABNORMAL LOW (ref 36.0–46.0)
Hemoglobin: 9.5 g/dL — ABNORMAL LOW (ref 12.0–15.0)
MCH: 31.6 pg (ref 26.0–34.0)
MCHC: 33.2 g/dL (ref 30.0–36.0)
MCV: 95 fL (ref 80.0–100.0)
Platelets: 146 10*3/uL — ABNORMAL LOW (ref 150–400)
RBC: 3.01 MIL/uL — ABNORMAL LOW (ref 3.87–5.11)
RDW: 14.2 % (ref 11.5–15.5)
WBC: 18.1 10*3/uL — ABNORMAL HIGH (ref 4.0–10.5)
nRBC: 0 % (ref 0.0–0.2)

## 2023-03-24 LAB — POCT I-STAT 7, (LYTES, BLD GAS, ICA,H+H)
Acid-base deficit: 2 mmol/L (ref 0.0–2.0)
Bicarbonate: 22.3 mmol/L (ref 20.0–28.0)
Calcium, Ion: 0.98 mmol/L — ABNORMAL LOW (ref 1.15–1.40)
HCT: 22 % — ABNORMAL LOW (ref 36.0–46.0)
Hemoglobin: 7.5 g/dL — ABNORMAL LOW (ref 12.0–15.0)
O2 Saturation: 100 %
Potassium: 4.4 mmol/L (ref 3.5–5.1)
Sodium: 140 mmol/L (ref 135–145)
TCO2: 23 mmol/L (ref 22–32)
pCO2 arterial: 36.5 mmHg (ref 32–48)
pH, Arterial: 7.395 (ref 7.35–7.45)
pO2, Arterial: 372 mmHg — ABNORMAL HIGH (ref 83–108)

## 2023-03-24 LAB — GLUCOSE, CAPILLARY
Glucose-Capillary: 102 mg/dL — ABNORMAL HIGH (ref 70–99)
Glucose-Capillary: 114 mg/dL — ABNORMAL HIGH (ref 70–99)
Glucose-Capillary: 116 mg/dL — ABNORMAL HIGH (ref 70–99)
Glucose-Capillary: 123 mg/dL — ABNORMAL HIGH (ref 70–99)
Glucose-Capillary: 125 mg/dL — ABNORMAL HIGH (ref 70–99)
Glucose-Capillary: 155 mg/dL — ABNORMAL HIGH (ref 70–99)
Glucose-Capillary: 74 mg/dL (ref 70–99)

## 2023-03-24 LAB — SURGICAL PATHOLOGY

## 2023-03-24 MED ORDER — METOLAZONE 2.5 MG PO TABS
2.5000 mg | ORAL_TABLET | Freq: Once | ORAL | Status: AC
  Administered 2023-03-24: 2.5 mg via ORAL
  Filled 2023-03-24: qty 1

## 2023-03-24 MED FILL — Lidocaine HCl Local Preservative Free (PF) Inj 2%: INTRAMUSCULAR | Qty: 14 | Status: AC

## 2023-03-24 MED FILL — Heparin Sodium (Porcine) Inj 1000 Unit/ML: Qty: 1000 | Status: AC

## 2023-03-24 MED FILL — Potassium Chloride Inj 2 mEq/ML: INTRAVENOUS | Qty: 40 | Status: AC

## 2023-03-24 MED FILL — Heparin Sodium (Porcine) Inj 1000 Unit/ML: INTRAMUSCULAR | Qty: 2500 | Status: AC

## 2023-03-24 NOTE — Progress Notes (Addendum)
TCTS DAILY ICU PROGRESS NOTE                   [address]            [name] [address]          [phone]   2 Days Post-Op Procedure(s) (LRB): AORTIC VALVE REPLACEMENT (AVR) USING A INSPIRIS RESILIA  VALVE. (N/A) TRANSESOPHAGEAL ECHOCARDIOGRAM (N/A)  Total Length of Stay:  LOS: 2 days   Subjective: Patient sitting in chair, conversing with daughter. Patient has already walked this am.  Objective: Vital signs in last 24 hours: Temp:  [98.5 F (36.9 C)-100.4 F (38 C)] 98.7 F (37.1 C) (09/05 0801) Pulse Rate:  [67-93] 84 (09/05 0700) Cardiac Rhythm: Normal sinus rhythm (09/05 0400) Resp:  [16-28] 21 (09/05 0700) BP: (101-177)/(44-81) 135/66 (09/05 0700) SpO2:  [88 %-97 %] 92 % (09/05 0700) Arterial Line BP: (146-169)/(44-58) 165/53 (09/04 0905) Weight:  [77.3 kg] 77.3 kg (09/05 0500)  Filed Weights   03/22/23 0541 03/23/23 0500 03/24/23 0500  Weight: 70.3 kg 78.3 kg 77.3 kg    Weight change: -1 kg   Hemodynamic parameters for last 24 hours: CVP:  [5 mmHg] 5 mmHg  Intake/Output from previous day: 09/04 0701 - 09/05 0700 In: 466.1 [I.V.:66.1; IV Piggyback:400] Out: 2230 [Urine:2180; Chest Tube:50]  Intake/Output this shift: No intake/output data recorded.  Current Meds: Scheduled Meds:  acetaminophen  1,000 mg Oral Q6H   Or   acetaminophen (TYLENOL) oral liquid 160 mg/5 mL  1,000 mg Per Tube Q6H   aspirin EC  325 mg Oral Daily   Or   aspirin  324 mg Per Tube Daily   atorvastatin  10 mg Oral Daily   bisacodyl  10 mg Oral Daily   Or   bisacodyl  10 mg Rectal Daily   Chlorhexidine Gluconate Cloth  6 each Topical Daily   docusate sodium  200 mg Oral Daily   furosemide  40 mg Intravenous BID   levothyroxine  112 mcg Oral QAC breakfast   metoprolol tartrate  12.5 mg Oral BID   Or   metoprolol tartrate  12.5 mg Per Tube BID   pantoprazole  40 mg Oral Daily   potassium chloride  20 mEq Oral BID   sodium chloride flush  3 mL  Intravenous Q12H   Continuous Infusions:  sodium chloride Stopped (03/23/23 0807)   lactated ringers Stopped (03/23/23 1005)   lactated ringers Stopped (03/22/23 2003)   PRN Meds:.metoprolol tartrate, morphine injection, ondansetron (ZOFRAN) IV, mouth rinse, oxyCODONE, sodium chloride flush, traMADol  General appearance: alert, cooperative, and no distress Neurologic: intact Heart: RRR, no murmur Lungs: Slightly diminished bibasilar breath sounds Abdomen: Soft, non tender, bowel sounds present Extremities: Mild LE edema Wound: Clean and dry. No sign of infection.  Lab Results: CBC: Recent Labs    03/23/23 1718 03/24/23 0411  WBC 22.0* 18.1*  HGB 9.8* 9.5*  HCT 29.2* 28.6*  PLT 160 146*   BMET:  Recent Labs    03/23/23 1718 03/24/23 0411  NA 134* 134*  K 4.5 4.7  CL 104 103  CO2 23 24  GLUCOSE 126* 113*  BUN 17 16  CREATININE 0.75 0.83  CALCIUM 8.3* 8.3*    CMET: Lab Results  Component Value Date   WBC 18.1 (H) 03/24/2023   HGB 9.5 (L) 03/24/2023   HCT 28.6 (L) 03/24/2023   PLT 146 (L) 03/24/2023   GLUCOSE 113 (H) 03/24/2023   ALT 15  03/18/2023   AST 17 03/18/2023   NA 134 (L) 03/24/2023   K 4.7 03/24/2023   CL 103 03/24/2023   CREATININE 0.83 03/24/2023   BUN 16 03/24/2023   CO2 24 03/24/2023   INR 1.4 (H) 03/22/2023   HGBA1C 5.2 03/18/2023    PT/INR:  Recent Labs    03/22/23 1147  LABPROT 17.6*  INR 1.4*   Radiology: No results found.   Assessment/Plan: S/P Procedure(s) (LRB): AORTIC VALVE REPLACEMENT (AVR) USING A INSPIRIS RESILIA  VALVE. (N/A) TRANSESOPHAGEAL ECHOCARDIOGRAM (N/A) CV-SR. On Lopressor 12.5 mg bid Pulmonary-on 5 L via [place]. Wean as able. CXR this am appears to show bilateral pleural effusions/atelectasis. Encourage incentive spirometer Volume overload-per Dr. Laneta Simmers, she will be given IV Lasix bid and Metolazone today. CBGs 74/123/155. Pre op HGA1C 5.2. Will stop acc checks and SS upon transfer Expected post op blood  loss anemia-H and H this am stable at 9.5 and 28.6 6. As discussed with Dr. Laneta Simmers, she is to remain in ICU today 7. Leukocytosis-Fever to 100.4. Has atelectasis on CXR.  WBC slightly decreased to 18,100. She was given Solumedrol early post op for possible allergic reaction (which was not). WBC should continue to decrease. 8. History of hypothyroidism-continue Levothyroxine 9. Remove foley later today  Ardelle Balls PA-C 03/24/2023 8:05 AM  Patient examined and today's chest x-ray image personally reviewed. Doing very well, walked in the hallway at 6:30 AM. Chest x-ray shows increased fluid and Lasix has been ordered today Will DC Foley today and DC central line with plan transfer to progressive care tomorrow.  patient examined and medical record reviewed,agree with above note. Lovett Sox 03/24/2023

## 2023-03-24 NOTE — Plan of Care (Signed)
  Problem: Education: Goal: Understanding of CV disease, CV risk reduction, and recovery process will improve Outcome: Progressing   Problem: Activity: Goal: Ability to return to baseline activity level will improve Outcome: Progressing   Problem: Cardiovascular: Goal: Ability to achieve and maintain adequate cardiovascular perfusion will improve Outcome: Progressing   Problem: Health Behavior/Discharge Planning: Goal: Ability to safely manage health-related needs after discharge will improve Outcome: Progressing   Problem: Education: Goal: Knowledge of General Education information will improve Description: Including pain rating scale, medication(s)/side effects and non-pharmacologic comfort measures Outcome: Progressing   Problem: Clinical Measurements: Goal: Ability to maintain clinical measurements within normal limits will improve Outcome: Progressing Goal: Will remain free from infection Outcome: Progressing   Problem: Activity: Goal: Risk for activity intolerance will decrease Outcome: Progressing   Problem: Nutrition: Goal: Adequate nutrition will be maintained Outcome: Progressing

## 2023-03-24 NOTE — Progress Notes (Signed)
     [address]       [name] [address]             [phone]       EVENING ROUNDS  POD #2 SP AVR Good diuresis today For transfer to floor soon

## 2023-03-25 ENCOUNTER — Inpatient Hospital Stay (HOSPITAL_COMMUNITY): Payer: Medicare Other

## 2023-03-25 LAB — BASIC METABOLIC PANEL
Anion gap: 8 (ref 5–15)
BUN: 12 mg/dL (ref 8–23)
CO2: 28 mmol/L (ref 22–32)
Calcium: 8.7 mg/dL — ABNORMAL LOW (ref 8.9–10.3)
Chloride: 98 mmol/L (ref 98–111)
Creatinine, Ser: 0.68 mg/dL (ref 0.44–1.00)
GFR, Estimated: >60 mL/min (ref >60)
Glucose, Bld: 94 mg/dL (ref 70–99)
Potassium: 3.8 mmol/L (ref 3.5–5.1)
Sodium: 134 mmol/L — ABNORMAL LOW (ref 135–145)

## 2023-03-25 LAB — CBC
HCT: 29.8 % — ABNORMAL LOW (ref 36.0–46.0)
Hemoglobin: 9.8 g/dL — ABNORMAL LOW (ref 12.0–15.0)
MCH: 31.1 pg (ref 26.0–34.0)
MCHC: 32.9 g/dL (ref 30.0–36.0)
MCV: 94.6 fL (ref 80.0–100.0)
Platelets: 150 10*3/uL (ref 150–400)
RBC: 3.15 MIL/uL — ABNORMAL LOW (ref 3.87–5.11)
RDW: 13.7 % (ref 11.5–15.5)
WBC: 11.3 10*3/uL — ABNORMAL HIGH (ref 4.0–10.5)
nRBC: 0 % (ref 0.0–0.2)

## 2023-03-25 LAB — GLUCOSE, CAPILLARY
Glucose-Capillary: 107 mg/dL — ABNORMAL HIGH (ref 70–99)
Glucose-Capillary: 159 mg/dL — ABNORMAL HIGH (ref 70–99)
Glucose-Capillary: 94 mg/dL (ref 70–99)
Glucose-Capillary: 94 mg/dL (ref 70–99)
Glucose-Capillary: 99 mg/dL (ref 70–99)

## 2023-03-25 MED ORDER — METOPROLOL TARTRATE 25 MG/10 ML ORAL SUSPENSION
25.0000 mg | Freq: Two times a day (BID) | ORAL | Status: DC
  Filled 2023-03-25 (×4): qty 10

## 2023-03-25 MED ORDER — POTASSIUM CHLORIDE CRYS ER 20 MEQ PO TBCR
20.0000 meq | EXTENDED_RELEASE_TABLET | Freq: Every day | ORAL | Status: DC
  Administered 2023-03-26 – 2023-03-27 (×2): 20 meq via ORAL
  Filled 2023-03-25 (×2): qty 1

## 2023-03-25 MED ORDER — METOPROLOL TARTRATE 25 MG PO TABS
25.0000 mg | ORAL_TABLET | Freq: Two times a day (BID) | ORAL | Status: DC
  Administered 2023-03-25 – 2023-03-27 (×4): 25 mg via ORAL
  Filled 2023-03-25 (×5): qty 1

## 2023-03-25 MED ORDER — DOCUSATE SODIUM 100 MG PO CAPS: 200.0000 mg | ORAL_CAPSULE | Freq: Every day | ORAL | Status: DC

## 2023-03-25 MED ORDER — BISACODYL 5 MG PO TBEC: 10.0000 mg | DELAYED_RELEASE_TABLET | Freq: Every day | ORAL | Status: DC

## 2023-03-25 MED ORDER — POTASSIUM CHLORIDE CRYS ER 20 MEQ PO TBCR
20.0000 meq | EXTENDED_RELEASE_TABLET | Freq: Two times a day (BID) | ORAL | Status: AC
  Administered 2023-03-25 (×2): 20 meq via ORAL
  Filled 2023-03-25 (×2): qty 1

## 2023-03-25 MED ORDER — FUROSEMIDE 10 MG/ML IJ SOLN
40.0000 mg | Freq: Once | INTRAMUSCULAR | Status: AC
  Administered 2023-03-25: 40 mg via INTRAVENOUS
  Filled 2023-03-25: qty 4

## 2023-03-25 MED ORDER — BISACODYL 10 MG RE SUPP: 10.0000 mg | Freq: Every day | RECTAL | Status: DC

## 2023-03-25 MED FILL — Sodium Bicarbonate IV Soln 8.4%: INTRAVENOUS | Qty: 50 | Status: AC

## 2023-03-25 MED FILL — Sodium Chloride IV Soln 0.9%: INTRAVENOUS | Qty: 2000 | Status: AC

## 2023-03-25 MED FILL — Electrolyte-R (PH 7.4) Solution: INTRAVENOUS | Qty: 3000 | Status: AC

## 2023-03-25 MED FILL — Heparin Sodium (Porcine) Inj 1000 Unit/ML: INTRAMUSCULAR | Qty: 10 | Status: AC

## 2023-03-25 MED FILL — Mannitol IV Soln 20%: INTRAVENOUS | Qty: 500 | Status: AC

## 2023-03-25 NOTE — Progress Notes (Addendum)
TCTS DAILY ICU PROGRESS NOTE                   [address]            [place] [address]          [phone]   3 Days Post-Op Procedure(s) (LRB): AORTIC VALVE REPLACEMENT (AVR) USING A INSPIRIS RESILIA  VALVE. (N/A) TRANSESOPHAGEAL ECHOCARDIOGRAM (N/A)  Total Length of Stay:  LOS: 3 days   Subjective: Patient had 2 bowel movements yesterday;stool becoming loose. She also stated "I peed a lot yesterday".  Objective: Vital signs in last 24 hours: Temp:  [98.7 F (37.1 C)-99.2 F (37.3 C)] 98.7 F (37.1 C) (09/06 0321) Pulse Rate:  [82-107] 96 (09/06 0600) Cardiac Rhythm: Normal sinus rhythm (09/06 0445) Resp:  [14-31] 21 (09/06 0600) BP: (115-162)/(54-93) 153/71 (09/06 0600) SpO2:  [92 %-97 %] 96 % (09/06 0600) Weight:  [71.9 kg] 71.9 kg (09/06 0500)  Filed Weights   03/23/23 0500 03/24/23 0500 03/25/23 0500  Weight: 78.3 kg 77.3 kg 71.9 kg    Weight change: -5.4 kg      Intake/Output from previous day: 09/05 0701 - 09/06 0700 In: 3 [I.V.:3] Out: 5151 [Urine:5150; Stool:1]  Intake/Output this shift: No intake/output data recorded.  Current Meds: Scheduled Meds:  acetaminophen  1,000 mg Oral Q6H   Or   acetaminophen (TYLENOL) oral liquid 160 mg/5 mL  1,000 mg Per Tube Q6H   aspirin EC  325 mg Oral Daily   Or   aspirin  324 mg Per Tube Daily   atorvastatin  10 mg Oral Daily   bisacodyl  10 mg Oral Daily   Or   bisacodyl  10 mg Rectal Daily   Chlorhexidine Gluconate Cloth  6 each Topical Daily   docusate sodium  200 mg Oral Daily   levothyroxine  112 mcg Oral QAC breakfast   metoprolol tartrate  12.5 mg Oral BID   Or   metoprolol tartrate  12.5 mg Per Tube BID   pantoprazole  40 mg Oral Daily   sodium chloride flush  3 mL Intravenous Q12H   Continuous Infusions:  sodium chloride Stopped (03/23/23 0807)   lactated ringers Stopped (03/23/23 1005)   lactated ringers Stopped (03/22/23 2003)   PRN Meds:.metoprolol tartrate,  morphine injection, ondansetron (ZOFRAN) IV, mouth rinse, oxyCODONE, sodium chloride flush, traMADol  General appearance: alert, cooperative, and no distress Neurologic: intact Heart: RRR, no murmur Lungs: Slightly diminished bibasilar breath sounds Abdomen: Soft, non tender, bowel sounds present Extremities: Mild LE edema Wound: Clean and dry. No sign of infection.  Lab Results: CBC: Recent Labs    03/24/23 0411 03/25/23 0500  WBC 18.1* 11.3*  HGB 9.5* 9.8*  HCT 28.6* 29.8*  PLT 146* 150   BMET:  Recent Labs    03/24/23 0411 03/25/23 0500  NA 134* 134*  K 4.7 3.8  CL 103 98  CO2 24 28  GLUCOSE 113* 94  BUN 16 12  CREATININE 0.83 0.68  CALCIUM 8.3* 8.7*    CMET: Lab Results  Component Value Date   WBC 11.3 (H) 03/25/2023   HGB 9.8 (L) 03/25/2023   HCT 29.8 (L) 03/25/2023   PLT 150 03/25/2023   GLUCOSE 94 03/25/2023   ALT 15 03/18/2023   AST 17 03/18/2023   NA 134 (L) 03/25/2023   K 3.8 03/25/2023   CL 98 03/25/2023   CREATININE 0.68 03/25/2023   BUN 12 03/25/2023   CO2 28  03/25/2023   INR 1.4 (H) 03/22/2023   HGBA1C 5.2 03/18/2023    PT/INR:  Recent Labs    03/22/23 1147  LABPROT 17.6*  INR 1.4*   Radiology: No results found.   Assessment/Plan: S/P Procedure(s) (LRB): AORTIC VALVE REPLACEMENT (AVR) USING A INSPIRIS RESILIA  VALVE. (N/A) TRANSESOPHAGEAL ECHOCARDIOGRAM (N/A) CV-SR. On Lopressor 12.5 mg bid;increase Lopressor to 25 mg bid. May be able to restart Benazepril in am. Pulmonary-on 2 L via [place]. Wean as able. CXR this am appears to show bilateral pleural effusions/atelectasis. Encourage incentive spirometer Volume overload-give Lasix IV today CBGs 116/94/94. Pre op HGA1C 5.2. Will stop acc checks and SS upon transfer Expected post op blood loss anemia-H and H this am stable at 9.8 and 29.8 6. As discussed with Dr. Laneta Simmers, she is to remain in ICU today 7. Leukocytosis-No further fever. Has atelectasis on CXR.  WBC decreased to  11,300. She was given Solumedrol early post op for possible allergic reaction (which was not).  8. History of hypothyroidism-continue Levothyroxine 9. Supplement potassium 10. Hold stool softeners today 11. Remove sleeve 12. Transfer to floor  Donielle Margaretann Loveless PA-C 03/25/2023 7:12 AM   Agree with above Titrating BB Weaning O2.  R effusion.  Consider thoracentesis.  Currently not short of breath when walking.  Brendon Christoffel Keane Scrape

## 2023-03-25 NOTE — TOC Progression Note (Signed)
Transition of Care [facility]) - Progression Note    Patient Details  Name: Holly Patrick MRN: [id number] Date of Birth: 1945-07-28  Transition of [facility]) CM/SW Contact  Elliot Cousin, RN Phone Number: [phone] 03/25/2023, 2:04 PM  Clinical Narrative:  [facility] was able to accept referral for [facility] in patient area. Will need HH RN and PT orders with F2F.  Message sent to attending.     Expected Discharge Plan: Home w Home Health Services Barriers to Discharge: Continued Medical Work up  Expected Discharge Plan and Services   Discharge Planning Services: CM Consult Post Acute Care Choice: Home Health Living arrangements for the past 2 months: Single Family Home                                       Social Determinants of Health (SDOH) Interventions SDOH Screenings   Food Insecurity: No Food Insecurity (03/24/2023)  Housing: Low Risk  (03/24/2023)  Transportation Needs: No Transportation Needs (03/24/2023)  Utilities: Not At Risk (03/24/2023)  Tobacco Use: Low Risk  (03/22/2023)    Readmission Risk Interventions     No data to display

## 2023-03-25 NOTE — Plan of Care (Signed)
  Problem: Education: Goal: Understanding of CV disease, CV risk reduction, and recovery process will improve Outcome: Progressing Goal: Individualized Educational Video(s) Outcome: Progressing   Problem: Activity: Goal: Ability to return to baseline activity level will improve Outcome: Progressing   Problem: Cardiovascular: Goal: Ability to achieve and maintain adequate cardiovascular perfusion will improve Outcome: Progressing Goal: Vascular access site(s) Level 0-1 will be maintained Outcome: Progressing   Problem: Health Behavior/Discharge Planning: Goal: Ability to safely manage health-related needs after discharge will improve Outcome: Progressing   Problem: Education: Goal: Knowledge of General Education information will improve Description: Including pain rating scale, medication(s)/side effects and non-pharmacologic comfort measures Outcome: Progressing   Problem: Clinical Measurements: Goal: Ability to maintain clinical measurements within normal limits will improve Outcome: Progressing Goal: Will remain free from infection Outcome: Progressing Goal: Diagnostic test results will improve Outcome: Progressing Goal: Respiratory complications will improve Outcome: Progressing Goal: Cardiovascular complication will be avoided Outcome: Progressing   Problem: Activity: Goal: Risk for activity intolerance will decrease Outcome: Progressing   Problem: Nutrition: Goal: Adequate nutrition will be maintained Outcome: Progressing   Problem: Coping: Goal: Level of anxiety will decrease Outcome: Progressing   Problem: Pain Managment: Goal: General experience of comfort will improve Outcome: Progressing

## 2023-03-25 NOTE — TOC Progression Note (Signed)
Transition of Care [facility]) - Progression Note    Patient Details  Name: Holly Patrick MRN: [id number] Date of Birth: 1946-04-02  Transition of Care [facility]) CM/SW Contact  Elliot Cousin, RN Phone Number: [phone] 03/25/2023, 10:07 AM  Clinical Narrative:  Received notification that they do not service her area. Will reach out to [facility] for [facility].     Expected Discharge Plan: Home w Home Health Services Barriers to Discharge: Continued Medical Work up  Expected Discharge Plan and Services   Discharge Planning Services: CM Consult Post Acute Care Choice: Home Health Living arrangements for the past 2 months: Single Family Home                                       Social Determinants of Health (SDOH) Interventions SDOH Screenings   Food Insecurity: No Food Insecurity (03/24/2023)  Housing: Low Risk  (03/24/2023)  Transportation Needs: No Transportation Needs (03/24/2023)  Utilities: Not At Risk (03/24/2023)  Tobacco Use: Low Risk  (03/22/2023)    Readmission Risk Interventions     No data to display

## 2023-03-25 NOTE — Progress Notes (Signed)
CARDIAC REHAB PHASE I   PRE:  Rate/Rhythm: 95 SR  BP:  Sitting: 118/72      SaO2: 98 RA   MODE:  Ambulation: 370 ft   POST:  Rate/Rhythm: 94 SR  BP:  Sitting: 118/72      SaO2: 96 RA  Pt ambulated in hallway, using EVA walker, moving at a quick steady pace. Returned to chair with call bell and bedside table in reach. Encouraged mobility and IS use. Will continue to follow.   [id number]  Woodroe Chen, RN BSN 03/25/2023 2:49 PM

## 2023-03-26 ENCOUNTER — Inpatient Hospital Stay (HOSPITAL_COMMUNITY): Payer: Medicare Other

## 2023-03-26 LAB — BASIC METABOLIC PANEL
Anion gap: 7 (ref 5–15)
BUN: 13 mg/dL (ref 8–23)
CO2: 26 mmol/L (ref 22–32)
Calcium: 8.5 mg/dL — ABNORMAL LOW (ref 8.9–10.3)
Chloride: 97 mmol/L — ABNORMAL LOW (ref 98–111)
Creatinine, Ser: 0.68 mg/dL (ref 0.44–1.00)
GFR, Estimated: >60 mL/min (ref >60)
Glucose, Bld: 98 mg/dL (ref 70–99)
Potassium: 4 mmol/L (ref 3.5–5.1)
Sodium: 130 mmol/L — ABNORMAL LOW (ref 135–145)

## 2023-03-26 LAB — CBC
HCT: 30.2 % — ABNORMAL LOW (ref 36.0–46.0)
Hemoglobin: 10.1 g/dL — ABNORMAL LOW (ref 12.0–15.0)
MCH: 30.9 pg (ref 26.0–34.0)
MCHC: 33.4 g/dL (ref 30.0–36.0)
MCV: 92.4 fL (ref 80.0–100.0)
Platelets: 194 10*3/uL (ref 150–400)
RBC: 3.27 MIL/uL — ABNORMAL LOW (ref 3.87–5.11)
RDW: 13.3 % (ref 11.5–15.5)
WBC: 10 10*3/uL (ref 4.0–10.5)
nRBC: 0 % (ref 0.0–0.2)

## 2023-03-26 MED ORDER — BISACODYL 10 MG RE SUPP: 10.0000 mg | Freq: Every day | RECTAL | Status: DC | PRN

## 2023-03-26 MED ORDER — FUROSEMIDE 10 MG/ML IJ SOLN
40.0000 mg | Freq: Once | INTRAMUSCULAR | Status: AC
  Administered 2023-03-26: 40 mg via INTRAVENOUS
  Filled 2023-03-26: qty 4

## 2023-03-26 MED ORDER — BISACODYL 5 MG PO TBEC: 10.0000 mg | DELAYED_RELEASE_TABLET | Freq: Every day | ORAL | Status: DC | PRN

## 2023-03-26 MED ORDER — DOCUSATE SODIUM 100 MG PO CAPS: 200.0000 mg | ORAL_CAPSULE | Freq: Every day | ORAL | Status: DC | PRN

## 2023-03-26 MED ORDER — BENAZEPRIL HCL 5 MG PO TABS
20.0000 mg | ORAL_TABLET | Freq: Every day | ORAL | Status: DC
  Administered 2023-03-26 – 2023-03-28 (×3): 20 mg via ORAL
  Filled 2023-03-26 (×3): qty 4

## 2023-03-26 NOTE — Progress Notes (Addendum)
CARDIAC REHAB PHASE I   PRE:  Rate/Rhythm: 96 SR   BP:  Sitting: 130/63      SaO2: 96 RA   MODE:  Ambulation: 470 ft   POST:  Rate/Rhythm: 124 ST   BP:  Sitting: 147/83      SaO2: 97 RA   Pt  ambulated in hallway, moving at slow steady pace. Tolerated well with no pain, dizziness or SOB. Returned to chair with call bell and bedside table in reach. Post OHS education including site care, restrictions, risk factors, exercise guidelines, OHS booklet, sternal precautions, IS use at home, home needs at discharge, heart healthy diet and CRP2 reviewed. All questions and concerns addressed. Will refer to [facility] for CRP2. Will continue to follow.   [id number]  Woodroe Chen, RN BSN 03/26/2023 9:29 AM

## 2023-03-26 NOTE — Plan of Care (Signed)
  Problem: Education: Goal: Understanding of CV disease, CV risk reduction, and recovery process will improve Outcome: Progressing Goal: Individualized Educational Video(s) Outcome: Progressing   Problem: Activity: Goal: Ability to return to baseline activity level will improve Outcome: Progressing   Problem: Cardiovascular: Goal: Ability to achieve and maintain adequate cardiovascular perfusion will improve Outcome: Progressing Goal: Vascular access site(s) Level 0-1 will be maintained Outcome: Progressing   Problem: Health Behavior/Discharge Planning: Goal: Ability to safely manage health-related needs after discharge will improve Outcome: Progressing   Problem: Education: Goal: Knowledge of General Education information will improve Description: Including pain rating scale, medication(s)/side effects and non-pharmacologic comfort measures Outcome: Progressing   Problem: Health Behavior/Discharge Planning: Goal: Ability to manage health-related needs will improve Outcome: Progressing   Problem: Clinical Measurements: Goal: Ability to maintain clinical measurements within normal limits will improve Outcome: Progressing Goal: Will remain free from infection Outcome: Progressing Goal: Diagnostic test results will improve Outcome: Progressing Goal: Respiratory complications will improve Outcome: Progressing Goal: Cardiovascular complication will be avoided Outcome: Progressing   Problem: Activity: Goal: Risk for activity intolerance will decrease Outcome: Progressing   Problem: Nutrition: Goal: Adequate nutrition will be maintained Outcome: Progressing   Problem: Coping: Goal: Level of anxiety will decrease Outcome: Progressing   Problem: Elimination: Goal: Will not experience complications related to bowel motility Outcome: Progressing Goal: Will not experience complications related to urinary retention Outcome: Progressing   Problem: Pain Managment: Goal:  General experience of comfort will improve Outcome: Progressing   Problem: Safety: Goal: Ability to remain free from injury will improve Outcome: Progressing   Problem: Skin Integrity: Goal: Risk for impaired skin integrity will decrease Outcome: Progressing   Problem: Education: Goal: Will demonstrate proper wound care and an understanding of methods to prevent future damage Outcome: Progressing Goal: Knowledge of disease or condition will improve Outcome: Progressing Goal: Knowledge of the prescribed therapeutic regimen will improve Outcome: Progressing Goal: Individualized Educational Video(s) Outcome: Progressing   Problem: Activity: Goal: Risk for activity intolerance will decrease Outcome: Progressing   Problem: Cardiac: Goal: Will achieve and/or maintain hemodynamic stability Outcome: Progressing   Problem: Clinical Measurements: Goal: Postoperative complications will be avoided or minimized Outcome: Progressing   Problem: Respiratory: Goal: Respiratory status will improve Outcome: Progressing   Problem: Skin Integrity: Goal: Wound healing without signs and symptoms of infection Outcome: Progressing Goal: Risk for impaired skin integrity will decrease Outcome: Progressing   Problem: Urinary Elimination: Goal: Ability to achieve and maintain adequate renal perfusion and functioning will improve Outcome: Progressing   

## 2023-03-26 NOTE — Progress Notes (Signed)
      [address]       [place] [address]             [phone]      4 Days Post-Op Procedure(s) (LRB): AORTIC VALVE REPLACEMENT (AVR) USING A INSPIRIS RESILIA  VALVE. (N/A) TRANSESOPHAGEAL ECHOCARDIOGRAM (N/A) Subjective: Transferred from ICU yesterday.  Had a good day, feels she is progressing well. Pain controlled.  No new concerns.  BM x 2 yesterday.    Objective: Vital signs in last 24 hours: Temp:  [97.7 F (36.5 C)-99.1 F (37.3 C)] 98.3 F (36.8 C) (09/07 0730) Pulse Rate:  [87-115] 95 (09/07 0730) Cardiac Rhythm: Normal sinus rhythm (09/06 2153) Resp:  [17-25] 20 (09/07 0730) BP: (97-154)/(50-99) 130/63 (09/07 0730) SpO2:  [91 %-98 %] 96 % (09/07 0730)  Hemodynamic parameters for last 24 hours:    Intake/Output from previous day: 09/06 0701 - 09/07 0700 In: -  Out: 600 [Urine:600] Intake/Output this shift: Total I/O In: 240 [P.O.:240] Out: -   General appearance: alert, cooperative, and no distress Neurologic: intact Heart: RRR, no arrhythmias on monitor review.  Lungs: breath sounds clear, diminished bilat.  Abdomen: soft, NT, active bowel sounds.  Extremities: No peripheral edema Wound: the sternotomy incision and CT sites are intact and dry.   Lab Results: Recent Labs    03/25/23 0500 03/26/23 0320  WBC 11.3* 10.0  HGB 9.8* 10.1*  HCT 29.8* 30.2*  PLT 150 194   BMET:  Recent Labs    03/25/23 0500 03/26/23 0320  NA 134* 130*  K 3.8 4.0  CL 98 97*  CO2 28 26  GLUCOSE 94 98  BUN 12 13  CREATININE 0.68 0.68  CALCIUM 8.7* 8.5*    PT/INR: No results for input(s): "LABPROT", "INR" in the last 72 hours. ABG    Component Value Date/Time   PHART 7.360 03/22/2023 1756   HCO3 22.6 03/22/2023 1756   TCO2 24 03/22/2023 1756   ACIDBASEDEF 3.0 (H) 03/22/2023 1756   O2SAT 92 03/22/2023 1756   CBG (last 3)  Recent Labs    03/25/23 0752 03/25/23 1123 03/25/23 1631  GLUCAP 159* 107* 99     Assessment/Plan: S/P Procedure(s) (LRB): AORTIC VALVE REPLACEMENT (AVR) USING A INSPIRIS RESILIA  VALVE. (N/A) TRANSESOPHAGEAL ECHOCARDIOGRAM (N/A)  -POD4 bioprosthetic AVR for severe aortic stenosis. Progressing well with mobility. Good pain control.  Stable cardiac rhythm. BP trending up. Will resume her Lotensin today. Continue metoprolol, ASA, Lipitor.   -PULM- Normal work of breathing on RA with sat 96% this morning. CXR showing small bilateral effusions yesterday. Good diuresis past 24 hours. Will repeat the Lasix IV x 1 today. F/U CXR in AM.   -GI-tolerating POs. BM x 2 yesterday. Make stool softeners PRN.   -ENDO- no h/o DM. CBGs d/c'd.   -DVT PPX - enoxaparin daily, ambulate.   -Disposition- progressing well. Planning for discharge 1-2 days if rhythm stable and pleural effusions improving.    LOS: 4 days    Leary Roca, PA-C [phone] 03/26/2023

## 2023-03-27 ENCOUNTER — Inpatient Hospital Stay (HOSPITAL_COMMUNITY): Payer: Medicare Other

## 2023-03-27 LAB — BASIC METABOLIC PANEL
Anion gap: 14 (ref 5–15)
BUN: 17 mg/dL (ref 8–23)
CO2: 25 mmol/L (ref 22–32)
Calcium: 9.1 mg/dL (ref 8.9–10.3)
Chloride: 97 mmol/L — ABNORMAL LOW (ref 98–111)
Creatinine, Ser: 0.84 mg/dL (ref 0.44–1.00)
GFR, Estimated: >60 mL/min (ref >60)
Glucose, Bld: 104 mg/dL — ABNORMAL HIGH (ref 70–99)
Potassium: 4 mmol/L (ref 3.5–5.1)
Sodium: 136 mmol/L (ref 135–145)

## 2023-03-27 MED ORDER — METOPROLOL TARTRATE 25 MG PO TABS
37.5000 mg | ORAL_TABLET | Freq: Two times a day (BID) | ORAL | Status: DC
  Administered 2023-03-27 – 2023-03-28 (×2): 37.5 mg via ORAL
  Filled 2023-03-27 (×2): qty 1

## 2023-03-27 MED ORDER — FUROSEMIDE 40 MG PO TABS
40.0000 mg | ORAL_TABLET | Freq: Every day | ORAL | Status: DC
  Administered 2023-03-27: 40 mg via ORAL
  Filled 2023-03-27: qty 1

## 2023-03-27 MED ORDER — LIDOCAINE HCL (PF) 1 % IJ SOLN
5.0000 mL | Freq: Once | INTRAMUSCULAR | Status: AC
  Administered 2023-03-27: 5 mL via INTRADERMAL

## 2023-03-27 NOTE — Plan of Care (Signed)
  Problem: Education: Goal: Understanding of CV disease, CV risk reduction, and recovery process will improve Outcome: Progressing Goal: Individualized Educational Video(s) Outcome: Progressing   Problem: Activity: Goal: Ability to return to baseline activity level will improve Outcome: Progressing   Problem: Cardiovascular: Goal: Ability to achieve and maintain adequate cardiovascular perfusion will improve Outcome: Progressing Goal: Vascular access site(s) Level 0-1 will be maintained Outcome: Progressing   Problem: Health Behavior/Discharge Planning: Goal: Ability to safely manage health-related needs after discharge will improve Outcome: Progressing   Problem: Education: Goal: Knowledge of General Education information will improve Description: Including pain rating scale, medication(s)/side effects and non-pharmacologic comfort measures Outcome: Progressing   Problem: Health Behavior/Discharge Planning: Goal: Ability to manage health-related needs will improve Outcome: Progressing   Problem: Clinical Measurements: Goal: Ability to maintain clinical measurements within normal limits will improve Outcome: Progressing Goal: Will remain free from infection Outcome: Progressing Goal: Diagnostic test results will improve Outcome: Progressing Goal: Respiratory complications will improve Outcome: Progressing Goal: Cardiovascular complication will be avoided Outcome: Progressing   Problem: Activity: Goal: Risk for activity intolerance will decrease Outcome: Progressing   Problem: Nutrition: Goal: Adequate nutrition will be maintained Outcome: Progressing   Problem: Coping: Goal: Level of anxiety will decrease Outcome: Progressing   Problem: Elimination: Goal: Will not experience complications related to bowel motility Outcome: Progressing Goal: Will not experience complications related to urinary retention Outcome: Progressing   Problem: Pain Managment: Goal:  General experience of comfort will improve Outcome: Progressing   Problem: Safety: Goal: Ability to remain free from injury will improve Outcome: Progressing   Problem: Skin Integrity: Goal: Risk for impaired skin integrity will decrease Outcome: Progressing   Problem: Education: Goal: Will demonstrate proper wound care and an understanding of methods to prevent future damage Outcome: Progressing Goal: Knowledge of disease or condition will improve Outcome: Progressing Goal: Knowledge of the prescribed therapeutic regimen will improve Outcome: Progressing Goal: Individualized Educational Video(s) Outcome: Progressing   Problem: Activity: Goal: Risk for activity intolerance will decrease Outcome: Progressing   Problem: Cardiac: Goal: Will achieve and/or maintain hemodynamic stability Outcome: Progressing   Problem: Clinical Measurements: Goal: Postoperative complications will be avoided or minimized Outcome: Progressing   Problem: Respiratory: Goal: Respiratory status will improve Outcome: Progressing   Problem: Skin Integrity: Goal: Wound healing without signs and symptoms of infection Outcome: Progressing Goal: Risk for impaired skin integrity will decrease Outcome: Progressing   Problem: Urinary Elimination: Goal: Ability to achieve and maintain adequate renal perfusion and functioning will improve Outcome: Progressing   

## 2023-03-27 NOTE — Progress Notes (Addendum)
      [address]       [place] [address]             [phone]      5 Days Post-Op Procedure(s) (LRB): AORTIC VALVE REPLACEMENT (AVR) USING A INSPIRIS RESILIA  VALVE. (N/A) TRANSESOPHAGEAL ECHOCARDIOGRAM (N/A) Subjective:  Walked in the hall yesterday. Pain controlled.  No new concerns.   Objective: Vital signs in last 24 hours: Temp:  [98.3 F (36.8 C)-98.7 F (37.1 C)] 98.5 F (36.9 C) (09/08 0836) Pulse Rate:  [88-100] 99 (09/08 0836) Cardiac Rhythm: Normal sinus rhythm (09/08 0700) Resp:  [16-24] 16 (09/08 0836) BP: (107-135)/(67-76) 135/70 (09/08 0836) SpO2:  [90 %-98 %] 97 % (09/08 0836) Weight:  [70.2 kg] 70.2 kg (09/08 0418)     Intake/Output from previous day: 09/07 0701 - 09/08 0700 In: 240 [P.O.:240] Out: 700 [Urine:700] Intake/Output this shift: Total I/O In: -  Out: 800 [Urine:800]  General appearance: alert, cooperative, and no distress Neurologic: intact Heart: sinus tach in 120's this morning Lungs: breath sounds clear, diminished bilat.  Abdomen: soft, NT, active bowel sounds.  Extremities: No peripheral edema Wound: the sternotomy incision and CT sites are intact and dry.   Lab Results: Recent Labs    03/25/23 0500 03/26/23 0320  WBC 11.3* 10.0  HGB 9.8* 10.1*  HCT 29.8* 30.2*  PLT 150 194   BMET:  Recent Labs    03/26/23 0320 03/27/23 0330  NA 130* 136  K 4.0 4.0  CL 97* 97*  CO2 26 25  GLUCOSE 98 104*  BUN 13 17  CREATININE 0.68 0.84  CALCIUM 8.5* 9.1    PT/INR: No results for input(s): "LABPROT", "INR" in the last 72 hours. ABG    Component Value Date/Time   PHART 7.360 03/22/2023 1756   HCO3 22.6 03/22/2023 1756   TCO2 24 03/22/2023 1756   ACIDBASEDEF 3.0 (H) 03/22/2023 1756   O2SAT 92 03/22/2023 1756   CBG (last 3)  Recent Labs    03/25/23 0752 03/25/23 1123 03/25/23 1631  GLUCAP 159* 107* 99    Assessment/Plan: S/P Procedure(s) (LRB): AORTIC VALVE REPLACEMENT (AVR) USING A  INSPIRIS RESILIA  VALVE. (N/A) TRANSESOPHAGEAL ECHOCARDIOGRAM (N/A)  -POD5 bioprosthetic AVR for severe aortic stenosis. Progressing well with mobility. Good pain control.  Tachycardic today and BP stable. Increase the metoprolol and continue ASA, Lipitor.   -PULM- Normal work of breathing on RA with sat 96% this morning. CXR showing stable bilateral effusions that persist despite diuresis. Will request thoracic [place] by IR and right thoracentesis if indicated.  F/U CXR in AM.   -GI-tolerating POs. Having BM's.  -ENDO- no h/o DM. CBGs d/c'd.   -DVT PPX - enoxaparin daily, ambulate.   -Disposition- progressing well. Possible d/c 1-2 days.    LOS: 5 days    Leary Roca, [place] [phone] 03/27/2023  Agree with above Doing well Off O2, will [place] pleural space today Dispo planning  Indy Prestwood O Kharson Rasmusson

## 2023-03-27 NOTE — Procedures (Signed)
PROCEDURE SUMMARY:  Successful US guided right thoracentesis. Yielded 350 mL of dark yellow fluid. Pt tolerated procedure well. No immediate complications.  Specimen was not sent for labs. CXR ordered.  EBL < 5 mL  Hoyt Koch PA-C 03/27/2023 1:49 PM

## 2023-03-28 ENCOUNTER — Inpatient Hospital Stay (HOSPITAL_COMMUNITY): Payer: Medicare Other

## 2023-03-28 LAB — TYPE AND SCREEN
ABO/RH(D): O POS
Antibody Screen: NEGATIVE
Unit division: 0
Unit division: 0
Unit division: 0
Unit division: 0

## 2023-03-28 LAB — BPAM RBC
Blood Product Expiration Date: 202409262359
Blood Product Expiration Date: 202409262359
Blood Product Expiration Date: 202409262359
Blood Product Expiration Date: 202409262359
ISSUE DATE / TIME: 202409031627
ISSUE DATE / TIME: 202409041424
Unit Type and Rh: 5100
Unit Type and Rh: 5100
Unit Type and Rh: 5100
Unit Type and Rh: 5100

## 2023-03-28 MED ORDER — METOPROLOL TARTRATE 25 MG PO TABS: 37.5000 mg | ORAL_TABLET | Freq: Two times a day (BID) | ORAL | 3 refills | Status: DC

## 2023-03-28 MED ORDER — ASPIRIN 325 MG PO TBEC: 325.0000 mg | DELAYED_RELEASE_TABLET | Freq: Every day | ORAL | Status: AC

## 2023-03-28 MED ORDER — TRAMADOL HCL 50 MG PO TABS: 50.0000 mg | ORAL_TABLET | Freq: Four times a day (QID) | ORAL | 0 refills | Status: DC | PRN

## 2023-03-28 NOTE — TOC Transition Note (Signed)
Transition of Care (TOC) - CM/SW Discharge Note Donn Pierini RN, BSN Transitions of Care Unit 4E- RN Case Manager See Treatment Team for direct phone #   Patient Details  Name: Holly Patrick MRN: [id number] Date of Birth: 01-08-1946  [facility]) CM/SW Contact:  Darrold Span, RN Phone Number: 03/28/2023, 11:57 AM   Clinical Narrative:    Pt stable for transition home today, family to transport home.  Per previous CM note HH RN/PT set up with [facility]. Noted pt ambulated with Cardiac Rehab- 470 feet. CM spoke with [facility] liaison Tresa Endo who will contact pt to follow up on [facility] needs.   No further TOC needs noted.    Final next level of care: Home w Home Health Services Barriers to Discharge: Barriers Resolved   Patient Goals and CMS Choice CMS Medicare.gov Compare Post Acute Care list provided to:: Patient Choice offered to / list presented to : Patient  Discharge Placement                 Home w/ [facility]         Discharge Plan and Services Additional resources added to the After Visit Summary for     Discharge Planning Services: CM Consult Post Acute Care Choice: Home Health          DME Arranged: N/A DME Agency: NA       HH Arranged: RN, Disease Management HH Agency: [facility] Date [facility] Agency Contacted: 03/28/23 Time HH Agency Contacted: 1000 Representative spoke with at [facility] Agency: Tresa Endo  Social Determinants of Health (SDOH) Interventions SDOH Screenings   Food Insecurity: No Food Insecurity (03/24/2023)  Housing: Low Risk  (03/24/2023)  Transportation Needs: No Transportation Needs (03/24/2023)  Utilities: Not At Risk (03/24/2023)  Tobacco Use: Low Risk  (03/22/2023)     Readmission Risk Interventions    03/28/2023   11:57 AM  Readmission Risk Prevention Plan  Post Dischage Appt Complete  Medication Screening Complete  Transportation Screening Complete

## 2023-03-28 NOTE — Progress Notes (Signed)
CARDIAC REHAB PHASE I    Pt ready for discharge home. All post op education completed. Referral for CRP2 sent to [facility]   [phone]  Woodroe Chen, RN BSN 03/28/2023 9:04 AM

## 2023-03-28 NOTE — Plan of Care (Signed)
  Problem: Education: Goal: Understanding of CV disease, CV risk reduction, and recovery process will improve Outcome: Progressing Goal: Individualized Educational Video(s) Outcome: Progressing   Problem: Activity: Goal: Ability to return to baseline activity level will improve Outcome: Progressing   Problem: Cardiovascular: Goal: Ability to achieve and maintain adequate cardiovascular perfusion will improve Outcome: Progressing Goal: Vascular access site(s) Level 0-1 will be maintained Outcome: Progressing   Problem: Health Behavior/Discharge Planning: Goal: Ability to safely manage health-related needs after discharge will improve Outcome: Progressing   Problem: Education: Goal: Knowledge of General Education information will improve Description: Including pain rating scale, medication(s)/side effects and non-pharmacologic comfort measures Outcome: Progressing   Problem: Health Behavior/Discharge Planning: Goal: Ability to manage health-related needs will improve Outcome: Progressing   Problem: Clinical Measurements: Goal: Ability to maintain clinical measurements within normal limits will improve Outcome: Progressing Goal: Will remain free from infection Outcome: Progressing Goal: Diagnostic test results will improve Outcome: Progressing Goal: Respiratory complications will improve Outcome: Progressing Goal: Cardiovascular complication will be avoided Outcome: Progressing   Problem: Activity: Goal: Risk for activity intolerance will decrease Outcome: Progressing   Problem: Nutrition: Goal: Adequate nutrition will be maintained Outcome: Progressing   Problem: Coping: Goal: Level of anxiety will decrease Outcome: Progressing   Problem: Elimination: Goal: Will not experience complications related to bowel motility Outcome: Progressing Goal: Will not experience complications related to urinary retention Outcome: Progressing   Problem: Pain Managment: Goal:  General experience of comfort will improve Outcome: Progressing   Problem: Safety: Goal: Ability to remain free from injury will improve Outcome: Progressing   Problem: Skin Integrity: Goal: Risk for impaired skin integrity will decrease Outcome: Progressing   Problem: Education: Goal: Will demonstrate proper wound care and an understanding of methods to prevent future damage Outcome: Progressing Goal: Knowledge of disease or condition will improve Outcome: Progressing Goal: Knowledge of the prescribed therapeutic regimen will improve Outcome: Progressing Goal: Individualized Educational Video(s) Outcome: Progressing   Problem: Activity: Goal: Risk for activity intolerance will decrease Outcome: Progressing   Problem: Cardiac: Goal: Will achieve and/or maintain hemodynamic stability Outcome: Progressing   Problem: Clinical Measurements: Goal: Postoperative complications will be avoided or minimized Outcome: Progressing   Problem: Respiratory: Goal: Respiratory status will improve Outcome: Progressing   Problem: Skin Integrity: Goal: Wound healing without signs and symptoms of infection Outcome: Progressing Goal: Risk for impaired skin integrity will decrease Outcome: Progressing   Problem: Urinary Elimination: Goal: Ability to achieve and maintain adequate renal perfusion and functioning will improve Outcome: Progressing   

## 2023-03-28 NOTE — Progress Notes (Signed)
6 Days Post-Op Procedure(s) (LRB): AORTIC VALVE REPLACEMENT (AVR) USING A INSPIRIS RESILIA  VALVE. (N/A) TRANSESOPHAGEAL ECHOCARDIOGRAM (N/A) Subjective:  She feels well. Off oxygen and no SOB. Bowels working. Walking well.  Objective: Vital signs in last 24 hours: Temp:  [97.5 F (36.4 C)-98.7 F (37.1 C)] 98.7 F (37.1 C) (09/09 0442) Pulse Rate:  [84-99] 90 (09/09 0442) Cardiac Rhythm: Normal sinus rhythm (09/08 2100) Resp:  [16-20] 20 (09/09 0442) BP: (125-145)/(62-78) 144/78 (09/09 0442) SpO2:  [93 %-98 %] 94 % (09/09 0442) Weight:  [69 kg] 69 kg (09/09 0442)  Hemodynamic parameters for last 24 hours:    Intake/Output from previous day: 09/08 0701 - 09/09 0700 In: 238 [P.O.:238] Out: 1400 [Urine:1400] Intake/Output this shift: No intake/output data recorded.  General appearance: alert and cooperative Neurologic: intact Heart: regular rate and rhythm Lungs: clear to auscultation bilaterally Extremities: no edema Wound: incision healing well  Lab Results: Recent Labs    03/26/23 0320  WBC 10.0  HGB 10.1*  HCT 30.2*  PLT 194   BMET:  Recent Labs    03/26/23 0320 03/27/23 0330  NA 130* 136  K 4.0 4.0  CL 97* 97*  CO2 26 25  GLUCOSE 98 104*  BUN 13 17  CREATININE 0.68 0.84  CALCIUM 8.5* 9.1    PT/INR: No results for input(s): "LABPROT", "INR" in the last 72 hours. ABG    Component Value Date/Time   PHART 7.360 03/22/2023 1756   HCO3 22.6 03/22/2023 1756   TCO2 24 03/22/2023 1756   ACIDBASEDEF 3.0 (H) 03/22/2023 1756   O2SAT 92 03/22/2023 1756   CBG (last 3)  Recent Labs    03/25/23 0752 03/25/23 1123 03/25/23 1631  GLUCAP 159* 107* 99   CXR: much improved aeration of lung bases. Minimal left base atelectasis or effusion.  Assessment/Plan: S/P Procedure(s) (LRB): AORTIC VALVE REPLACEMENT (AVR) USING A INSPIRIS RESILIA  VALVE. (N/A) TRANSESOPHAGEAL ECHOCARDIOGRAM (N/A)  POD 6  She is hemodynamically stable in sinus rhythm.  Continue Lopressor and benazepril.   Wt is now below preop and she has no edema so will stop diuretic.  She can go home today.     LOS: 6 days    Alleen Borne 03/28/2023

## 2023-03-29 DIAGNOSIS — Z952 Presence of prosthetic heart valve: Secondary | ICD-10-CM | POA: Diagnosis not present

## 2023-03-29 DIAGNOSIS — I2510 Atherosclerotic heart disease of native coronary artery without angina pectoris: Secondary | ICD-10-CM | POA: Diagnosis not present

## 2023-03-29 DIAGNOSIS — Z48812 Encounter for surgical aftercare following surgery on the circulatory system: Secondary | ICD-10-CM | POA: Diagnosis not present

## 2023-03-29 DIAGNOSIS — Z96641 Presence of right artificial hip joint: Secondary | ICD-10-CM | POA: Diagnosis not present

## 2023-03-29 DIAGNOSIS — E039 Hypothyroidism, unspecified: Secondary | ICD-10-CM | POA: Diagnosis not present

## 2023-03-29 DIAGNOSIS — I5032 Chronic diastolic (congestive) heart failure: Secondary | ICD-10-CM | POA: Diagnosis not present

## 2023-03-29 DIAGNOSIS — I110 Hypertensive heart disease with heart failure: Secondary | ICD-10-CM | POA: Diagnosis not present

## 2023-03-29 DIAGNOSIS — Z955 Presence of coronary angioplasty implant and graft: Secondary | ICD-10-CM | POA: Diagnosis not present

## 2023-03-29 DIAGNOSIS — M810 Age-related osteoporosis without current pathological fracture: Secondary | ICD-10-CM | POA: Diagnosis not present

## 2023-03-29 DIAGNOSIS — I10 Essential (primary) hypertension: Secondary | ICD-10-CM | POA: Diagnosis not present

## 2023-03-29 DIAGNOSIS — G4733 Obstructive sleep apnea (adult) (pediatric): Secondary | ICD-10-CM | POA: Diagnosis not present

## 2023-03-30 ENCOUNTER — Telehealth (HOSPITAL_COMMUNITY): Payer: Self-pay

## 2023-03-30 ENCOUNTER — Encounter: Payer: Medicare Other | Admitting: Surgery

## 2023-03-30 NOTE — Telephone Encounter (Signed)
Per phase 1 Cardiac Rehab fax referral to [facility]

## 2023-04-01 DIAGNOSIS — G4733 Obstructive sleep apnea (adult) (pediatric): Secondary | ICD-10-CM | POA: Diagnosis not present

## 2023-04-01 DIAGNOSIS — I10 Essential (primary) hypertension: Secondary | ICD-10-CM | POA: Diagnosis not present

## 2023-04-01 DIAGNOSIS — M810 Age-related osteoporosis without current pathological fracture: Secondary | ICD-10-CM | POA: Diagnosis not present

## 2023-04-01 DIAGNOSIS — I110 Hypertensive heart disease with heart failure: Secondary | ICD-10-CM | POA: Diagnosis not present

## 2023-04-01 DIAGNOSIS — I2510 Atherosclerotic heart disease of native coronary artery without angina pectoris: Secondary | ICD-10-CM | POA: Diagnosis not present

## 2023-04-01 DIAGNOSIS — Z955 Presence of coronary angioplasty implant and graft: Secondary | ICD-10-CM | POA: Diagnosis not present

## 2023-04-01 DIAGNOSIS — I5032 Chronic diastolic (congestive) heart failure: Secondary | ICD-10-CM | POA: Diagnosis not present

## 2023-04-01 DIAGNOSIS — Z48812 Encounter for surgical aftercare following surgery on the circulatory system: Secondary | ICD-10-CM | POA: Diagnosis not present

## 2023-04-01 DIAGNOSIS — E039 Hypothyroidism, unspecified: Secondary | ICD-10-CM | POA: Diagnosis not present

## 2023-04-01 DIAGNOSIS — Z952 Presence of prosthetic heart valve: Secondary | ICD-10-CM | POA: Diagnosis not present

## 2023-04-01 DIAGNOSIS — Z96641 Presence of right artificial hip joint: Secondary | ICD-10-CM | POA: Diagnosis not present

## 2023-04-07 DIAGNOSIS — I110 Hypertensive heart disease with heart failure: Secondary | ICD-10-CM | POA: Diagnosis not present

## 2023-04-07 DIAGNOSIS — I2510 Atherosclerotic heart disease of native coronary artery without angina pectoris: Secondary | ICD-10-CM | POA: Diagnosis not present

## 2023-04-07 DIAGNOSIS — Z4801 Encounter for change or removal of surgical wound dressing: Secondary | ICD-10-CM

## 2023-04-07 DIAGNOSIS — Z48812 Encounter for surgical aftercare following surgery on the circulatory system: Secondary | ICD-10-CM | POA: Diagnosis not present

## 2023-04-07 DIAGNOSIS — I10 Essential (primary) hypertension: Secondary | ICD-10-CM | POA: Diagnosis not present

## 2023-04-07 DIAGNOSIS — I5032 Chronic diastolic (congestive) heart failure: Secondary | ICD-10-CM | POA: Diagnosis not present

## 2023-04-07 DIAGNOSIS — E039 Hypothyroidism, unspecified: Secondary | ICD-10-CM | POA: Diagnosis not present

## 2023-04-07 DIAGNOSIS — M810 Age-related osteoporosis without current pathological fracture: Secondary | ICD-10-CM | POA: Diagnosis not present

## 2023-04-07 DIAGNOSIS — Z96641 Presence of right artificial hip joint: Secondary | ICD-10-CM | POA: Diagnosis not present

## 2023-04-07 DIAGNOSIS — Z955 Presence of coronary angioplasty implant and graft: Secondary | ICD-10-CM | POA: Diagnosis not present

## 2023-04-07 DIAGNOSIS — Z952 Presence of prosthetic heart valve: Secondary | ICD-10-CM | POA: Diagnosis not present

## 2023-04-07 DIAGNOSIS — G4733 Obstructive sleep apnea (adult) (pediatric): Secondary | ICD-10-CM | POA: Diagnosis not present

## 2023-04-08 DIAGNOSIS — I110 Hypertensive heart disease with heart failure: Secondary | ICD-10-CM | POA: Diagnosis not present

## 2023-04-08 DIAGNOSIS — Z96641 Presence of right artificial hip joint: Secondary | ICD-10-CM | POA: Diagnosis not present

## 2023-04-08 DIAGNOSIS — E039 Hypothyroidism, unspecified: Secondary | ICD-10-CM | POA: Diagnosis not present

## 2023-04-08 DIAGNOSIS — I2510 Atherosclerotic heart disease of native coronary artery without angina pectoris: Secondary | ICD-10-CM | POA: Diagnosis not present

## 2023-04-08 DIAGNOSIS — Z955 Presence of coronary angioplasty implant and graft: Secondary | ICD-10-CM | POA: Diagnosis not present

## 2023-04-08 DIAGNOSIS — G4733 Obstructive sleep apnea (adult) (pediatric): Secondary | ICD-10-CM | POA: Diagnosis not present

## 2023-04-08 DIAGNOSIS — I5032 Chronic diastolic (congestive) heart failure: Secondary | ICD-10-CM | POA: Diagnosis not present

## 2023-04-08 DIAGNOSIS — Z952 Presence of prosthetic heart valve: Secondary | ICD-10-CM | POA: Diagnosis not present

## 2023-04-08 DIAGNOSIS — I10 Essential (primary) hypertension: Secondary | ICD-10-CM | POA: Diagnosis not present

## 2023-04-08 DIAGNOSIS — M810 Age-related osteoporosis without current pathological fracture: Secondary | ICD-10-CM | POA: Diagnosis not present

## 2023-04-08 DIAGNOSIS — Z48812 Encounter for surgical aftercare following surgery on the circulatory system: Secondary | ICD-10-CM | POA: Diagnosis not present

## 2023-04-11 DIAGNOSIS — E039 Hypothyroidism, unspecified: Secondary | ICD-10-CM | POA: Diagnosis not present

## 2023-04-11 DIAGNOSIS — E7800 Pure hypercholesterolemia, unspecified: Secondary | ICD-10-CM | POA: Diagnosis not present

## 2023-04-11 DIAGNOSIS — Z952 Presence of prosthetic heart valve: Secondary | ICD-10-CM | POA: Diagnosis not present

## 2023-04-11 DIAGNOSIS — I5032 Chronic diastolic (congestive) heart failure: Secondary | ICD-10-CM | POA: Diagnosis not present

## 2023-04-11 DIAGNOSIS — Z48812 Encounter for surgical aftercare following surgery on the circulatory system: Secondary | ICD-10-CM | POA: Diagnosis not present

## 2023-04-11 DIAGNOSIS — I10 Essential (primary) hypertension: Secondary | ICD-10-CM | POA: Diagnosis not present

## 2023-04-11 DIAGNOSIS — M810 Age-related osteoporosis without current pathological fracture: Secondary | ICD-10-CM | POA: Diagnosis not present

## 2023-04-11 DIAGNOSIS — Z139 Encounter for screening, unspecified: Secondary | ICD-10-CM | POA: Diagnosis not present

## 2023-04-11 DIAGNOSIS — G4733 Obstructive sleep apnea (adult) (pediatric): Secondary | ICD-10-CM | POA: Diagnosis not present

## 2023-04-11 DIAGNOSIS — Z79899 Other long term (current) drug therapy: Secondary | ICD-10-CM | POA: Diagnosis not present

## 2023-04-11 DIAGNOSIS — Z96641 Presence of right artificial hip joint: Secondary | ICD-10-CM | POA: Diagnosis not present

## 2023-04-11 DIAGNOSIS — I2510 Atherosclerotic heart disease of native coronary artery without angina pectoris: Secondary | ICD-10-CM | POA: Diagnosis not present

## 2023-04-11 DIAGNOSIS — Z1331 Encounter for screening for depression: Secondary | ICD-10-CM | POA: Diagnosis not present

## 2023-04-11 DIAGNOSIS — Z955 Presence of coronary angioplasty implant and graft: Secondary | ICD-10-CM | POA: Diagnosis not present

## 2023-04-11 DIAGNOSIS — I110 Hypertensive heart disease with heart failure: Secondary | ICD-10-CM | POA: Diagnosis not present

## 2023-04-11 NOTE — Progress Notes (Unsigned)
Cardiology Office Note:  .   Date:  04/12/2023  ID:  Holly Patrick, DOB 1946-05-23, MRN [id number] PCP: Lonie Peak, Cordelia Poche  [facility] Providers Cardiologist:  Charlton Haws, MD {  History of Present Illness: .   Holly Patrick is a 77 y.o. female who has had a history of aortic stenosis since 2019 here for follow-up appointment in the moderate range with mean gradient of 27 peak 47 mmHg AVA 1.1 cm  TTE 11/17/2023 with EF 65%, mean gradient 34 peak 58 and AVA 1 cm  Had right THR with Dr. Ophelia Charter and recovering well from that.  LDL 79.  TTE 05/31/2022 with no change in gradients and normal EF, severe MAC, mild MR.  AVA 0.90.  02/14/2023 with mean gradient 52 mmHg, peak 85 mmHg and AVA 0.69 cm.  Significant progression of AAS.  Discussed right and left heart catheterization and cardiac CTA and referral for TAVR.  She endorsed more dyspnea and fatigue but despite a jump in gradient functional capacity was still quite good.  Long discussion with her 2 children and patient about workup for TAVR to include right and left heart cath this week and CTA next week with consult to CVTS.  History includes husband passing away in 2021.  Married for 56 years.  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  Today, she has had some visits at home with PT. she is doing well post AVR with Dr. Laneta Simmers.  Blood pressure slightly elevated today however was normal earlier today after her morning medications.  She has follow-up with Dr. Laneta Simmers on 10/9.  She has follow-up echo already scheduled.  We discussed continuing to monitor blood pressure closely and continue a low-sodium diet.  She is excited to start cardiac rehab but needs to be cleared from a surgical standpoint.  Otherwise, doing well with limited amount of pain.  Sternotomy incision looks like it is healing well.  Reports no shortness of breath nor dyspnea on exertion. Reports no chest pain, pressure, or tightness. No edema, orthopnea, PND.  Reports no palpitations.   ROS: Pertinent ROS in HPI  Studies Reviewed: [name]       Interoperative echocardiogram 03/22/2023 Complications: No known complications during this procedure.  POST-OP IMPRESSIONS  _ Left Ventricle: The left ventricle is unchanged from pre-bypass.  _ Right Ventricle: The right ventricle appears unchanged from pre-bypass.  _ Aorta: The aorta appears unchanged from pre-bypass.  _ Left Atrial Appendage: The left atrial appendage appears unchanged from  pre-bypass.  _ Aortic Valve: There is a 23mm Inspiris valve in the Aortic position. The  valve  is well seated with no para-valvular leak.  _ Mitral Valve: The mitral valve appears unchanged from pre-bypass.  _ Tricuspid Valve: There is mild tricuspid regurgitation.  _ Pulmonic Valve: The pulmonic valve appears unchanged from pre-bypass.  _ Interatrial Septum: The interatrial septum appears unchanged from  pre-bypass.  _ Pericardium: The pericardium appears unchanged from pre-bypass.   PRE-OP FINDINGS   Left Ventricle: The left ventricle has normal systolic function, with an  ejection fraction of 60-65%. The cavity size was normal. There is  moderately increased left ventricular Aikens thickness. No evidence of left  ventricular regional Simien motion  abnormalities. There is moderate concentric left ventricular hypertrophy.  Left ventricular diastolic parameters are consistent with Grade I  diastolic dysfunction (impaired relaxation).    Right Ventricle: The right ventricle has normal systolic function. The  cavity was normal. There  is no increase in right ventricular Heiss  thickness.   Left Atrium: Left atrial size was not assessed. No left atrial/left atrial  appendage thrombus was detected.   Right Atrium: Right atrial size was not assessed.   Interatrial Septum: No atrial level shunt detected by color flow Doppler.   Pericardium: There is no evidence of pericardial effusion. There is no  pleural  effusion.   Mitral Valve: The Mitral valve leaflets appear degerative. There is mitral  annular calcification. There is calcification of the chordal apparatus  with chordal SAM. There is mild mitral regurgitation. There is no mitral  stenosis.     Tricuspid Valve: The tricuspid valve was normal in structure. Tricuspid  valve regurgitation is trivial by color flow Doppler.   Aortic Valve: The aortic valve is tricuspid Aortic valve regurgitation is  mild by color flow Doppler. There is severe stenosis of the aortic valve,  with a calculated valve area of 0.77 cm.    Pulmonic Valve: The pulmonic valve was normal in structure.  Pulmonic valve regurgitation is trivial by color flow Doppler.    Aorta: The aortic root is normal in size and structure. There is grade II  atheromatus disease of the descending thoracic aorta.      Physical Exam:   VS:  BP (!) 156/80   Pulse 75   Ht 5\' 3"  (1.6 m)   Wt 155 lb (70.3 kg)   SpO2 95%   BMI 27.46 kg/m    Wt Readings from Last 3 Encounters:  04/12/23 155 lb (70.3 kg)  03/28/23 152 lb 1.9 oz (69 kg)  03/18/23 156 lb 14.4 oz (71.2 kg)    GEN: Well nourished, well developed in no acute distress NECK: No JVD; No carotid bruits CARDIAC: RRR, no murmurs, rubs, gallops RESPIRATORY:  Clear to auscultation without rales, wheezing or rhonchi  ABDOMEN: Soft, non-tender, non-distended EXTREMITIES:  No edema; No deformity   ASSESSMENT AND PLAN: .   1.  Severe aortic stenosis s/p AVR -Continue aspirin 325 mg daily, Lipitor 10 mg daily, benazepril 20 mg daily, metoprolol tartrate 37.5 mg twice a day -She has had a minimal amount of pain from her sternotomy incision and is participating in home physical therapy with interested in cardiac rehab -She has an echocardiogram scheduled for next month  2.  HTN -Elevated today in the office 156/80 however was normal this morning after medications -We have suggested continuing current medicines for now and  continuing to monitor blood pressure an hour to an hour and a half after morning and evening medications and keeping a log.  If remains elevated will need to increase her benazepril  3.  Thyroid -Continue Synthroid 112 mcg -Recent TSH was 2.7 back in March  4.  Hyperlipidemia -Last LDL was done in March and was 85, HDL 72, total triglycerides 119 -Would continue Lipitor 10 mg daily -Will be due for updated lipid panel in the spring      Dispo: She can follow-up with Dr. Eden Emms in 3 to 4 months and Dr. Laneta Simmers in 2 weeks  Signed, Sharlene Dory, PA-C

## 2023-04-12 ENCOUNTER — Ambulatory Visit: Payer: Medicare Other | Attending: Physician Assistant | Admitting: Physician Assistant

## 2023-04-12 ENCOUNTER — Encounter: Payer: Self-pay | Admitting: Physician Assistant

## 2023-04-12 VITALS — BP 156/80 | HR 75 | Ht 63.0 in | Wt 155.0 lb

## 2023-04-12 DIAGNOSIS — I359 Nonrheumatic aortic valve disorder, unspecified: Secondary | ICD-10-CM | POA: Diagnosis not present

## 2023-04-12 DIAGNOSIS — Z952 Presence of prosthetic heart valve: Secondary | ICD-10-CM

## 2023-04-12 DIAGNOSIS — I350 Nonrheumatic aortic (valve) stenosis: Secondary | ICD-10-CM

## 2023-04-12 DIAGNOSIS — I10 Essential (primary) hypertension: Secondary | ICD-10-CM

## 2023-04-12 DIAGNOSIS — E782 Mixed hyperlipidemia: Secondary | ICD-10-CM | POA: Diagnosis not present

## 2023-04-12 NOTE — Patient Instructions (Signed)
Medication Instructions:  Your physician recommends that you continue on your current medications as directed. Please refer to the Current Medication list given to you today.  *If you need a refill on your cardiac medications before your next appointment, please call your pharmacy*  Lab Work: None ordered If you have labs (blood work) drawn today and your tests are completely normal, you will receive your results only by: MyChart Message (if you have MyChart) OR A paper copy in the mail If you have any lab test that is abnormal or we need to change your treatment, we will call you to review the results.  Follow-Up: At [facility], you and your health needs are our priority.  As part of our continuing mission to provide you with exceptional heart care, we have created designated Provider Care Teams.  These Care Teams include your primary Cardiologist (physician) and Advanced Practice Providers (APPs -  Physician Assistants and Nurse Practitioners) who all work together to provide you with the care you need, when you need it.  Your next appointment:   3-4 month(s)  Provider:   Charlton Haws, MD or APP  Other Instructions Check your blood pressure daily, 1 hr after morning medications for 2 weeks, keep a log and send [place] the readings through mychart at the end of the 2 weeks.   Low-Sodium Eating Plan Salt (sodium) helps you keep a healthy balance of fluids in your body. Too much sodium can raise your blood pressure. It can also cause fluid and waste to be held in your body. Your health care provider or dietitian may recommend a low-sodium eating plan if you have high blood pressure (hypertension), kidney disease, liver disease, or heart failure. Eating less sodium can help lower your blood pressure and reduce swelling. It can also protect your heart, liver, and kidneys. What are tips for following this plan? Reading food labels  Check food labels for the amount of sodium per  serving. If you eat more than one serving, you must multiply the listed amount by the number of servings. Choose foods with less than 140 milligrams (mg) of sodium per serving. Avoid foods with 300 mg of sodium or more per serving. Always check how much sodium is in a product, even if the label says "unsalted" or "no salt added." Shopping  Buy products labeled as "low-sodium" or "no salt added." Buy fresh foods. Avoid canned foods and pre-made or frozen meals. Avoid canned, cured, or processed meats. Buy breads that have less than 80 mg of sodium per slice. Cooking  Eat more home-cooked food. Try to eat less restaurant, buffet, and fast food. Try not to add salt when you cook. Use salt-free seasonings or herbs instead of table salt or sea salt. Check with your provider or pharmacist before using salt substitutes. Cook with plant-based oils, such as canola, sunflower, or olive oil. Meal planning When eating at a restaurant, ask if your food can be made with less salt or no salt. Avoid dishes labeled as brined, pickled, cured, or smoked. Avoid dishes made with soy sauce, miso, or teriyaki sauce. Avoid foods that have monosodium glutamate (MSG) in them. MSG may be added to some restaurant food, sauces, soups, bouillon, and canned foods. Make meals that can be grilled, baked, poached, roasted, or steamed. These are often made with less sodium. General information Try to limit your sodium intake to 1,500-2,300 mg each day, or the amount told by your provider. What foods should I eat? Fruits Fresh,  frozen, or canned fruit. Fruit juice. Vegetables Fresh or frozen vegetables. "No salt added" canned vegetables. "No salt added" tomato sauce and paste. Low-sodium or reduced-sodium tomato and vegetable juice. Grains Low-sodium cereals, such as oats, puffed wheat and rice, and shredded wheat. Low-sodium crackers. Unsalted rice. Unsalted pasta. Low-sodium bread. Whole grain breads and whole grain  pasta. Meats and other proteins Fresh or frozen meat, poultry, seafood, and fish. These should have no added salt. Low-sodium canned tuna and salmon. Unsalted nuts. Dried peas, beans, and lentils without added salt. Unsalted canned beans. Eggs. Unsalted nut butters. Dairy Milk. Soy milk. Cheese that is naturally low in sodium, such as ricotta cheese, fresh mozzarella, or Swiss cheese. Low-sodium or reduced-sodium cheese. Cream cheese. Yogurt. Seasonings and condiments Fresh and dried herbs and spices. Salt-free seasonings. Low-sodium mustard and ketchup. Sodium-free salad dressing. Sodium-free light mayonnaise. Fresh or refrigerated horseradish. Lemon juice. Vinegar. Other foods Homemade, reduced-sodium, or low-sodium soups. Unsalted popcorn and pretzels. Low-salt or salt-free chips. The items listed above may not be all the foods and drinks you can have. Talk to a dietitian to learn more. What foods should I avoid? Vegetables Sauerkraut, pickled vegetables, and relishes. Olives. [place] fries. Onion rings. Regular canned vegetables, except low-sodium or reduced-sodium items. Regular canned tomato sauce and paste. Regular tomato and vegetable juice. Frozen vegetables in sauces. Grains Instant hot cereals. Bread stuffing, pancake, and biscuit mixes. Croutons. Seasoned rice or pasta mixes. Noodle soup cups. Boxed or frozen macaroni and cheese. Regular salted crackers. Self-rising flour. Meats and other proteins Meat or fish that is salted, canned, smoked, spiced, or pickled. Precooked or cured meat, such as sausages or meat loaves. Tomasa Blase. Ham. Pepperoni. Hot dogs. Corned beef. Chipped beef. Salt pork. Jerky. Pickled herring, anchovies, and sardines. Regular canned tuna. Salted nuts. Dairy Processed cheese and cheese spreads. Hard cheeses. Cheese curds. Blue cheese. Feta cheese. String cheese. Regular cottage cheese. Buttermilk. Canned milk. Fats and oils Salted butter. Regular margarine. Ghee. Bacon  fat. Seasonings and condiments Onion salt, garlic salt, seasoned salt, table salt, and sea salt. Canned and packaged gravies. Worcestershire sauce. Tartar sauce. Barbecue sauce. Teriyaki sauce. Soy sauce, including reduced-sodium soy sauce. Steak sauce. Fish sauce. Oyster sauce. Cocktail sauce. Horseradish that you find on the shelf. Regular ketchup and mustard. Meat flavorings and tenderizers. Bouillon cubes. Hot sauce. Pre-made or packaged marinades. Pre-made or packaged taco seasonings. Relishes. Regular salad dressings. Salsa. Other foods Salted popcorn and pretzels. Corn chips and puffs. Potato and tortilla chips. Canned or dried soups. Pizza. Frozen entrees and pot pies. The items listed above may not be all the foods and drinks you should avoid. Talk to a dietitian to learn more. This information is not intended to replace advice given to you by your health care provider. Make sure you discuss any questions you have with your health care provider. Document Revised: 07/22/2022 Document Reviewed: 07/22/2022 Elsevier Patient Education  2024 [employer] Heart-Healthy Eating Plan Many factors influence your heart health, including eating and exercise habits. Heart health is also called coronary health. Coronary risk increases with abnormal blood fat (lipid) levels. A heart-healthy eating plan includes limiting unhealthy fats, increasing healthy fats, limiting salt (sodium) intake, and making other diet and lifestyle changes. What is my plan? Your health care provider may recommend that: You limit your fat intake to _________% or less of your total calories each day. You limit your saturated fat intake to _________% or less of your total calories each day. You limit the amount of  cholesterol in your diet to less than _________ mg per day. You limit the amount of sodium in your diet to less than _________ mg per day. What are tips for following this plan? Cooking Cook foods using methods other  than frying. Baking, boiling, grilling, and broiling are all good options. Other ways to reduce fat include: Removing the skin from poultry. Removing all visible fats from meats. Steaming vegetables in water or broth. Meal planning  At meals, imagine dividing your plate into fourths: Fill one-half of your plate with vegetables and green salads. Fill one-fourth of your plate with whole grains. Fill one-fourth of your plate with lean protein foods. Eat 2-4 cups of vegetables per day. One cup of vegetables equals 1 cup (91 g) broccoli or cauliflower florets, 2 medium carrots, 1 large bell pepper, 1 large sweet potato, 1 large tomato, 1 medium white potato, 2 cups (150 g) raw leafy greens. Eat 1-2 cups of fruit per day. One cup of fruit equals 1 small apple, 1 large banana, 1 cup (237 g) mixed fruit, 1 large orange,  cup (82 g) dried fruit, 1 cup (240 mL) 100% fruit juice. Eat more foods that contain soluble fiber. Examples include apples, broccoli, carrots, beans, peas, and barley. Aim to get 25-30 g of fiber per day. Increase your consumption of legumes, nuts, and seeds to 4-5 servings per week. One serving of dried beans or legumes equals  cup (90 g) cooked, 1 serving of nuts is  oz (12 almonds, 24 pistachios, or 7 walnut halves), and 1 serving of seeds equals  oz (8 g). Fats Choose healthy fats more often. Choose monounsaturated and polyunsaturated fats, such as olive and canola oils, avocado oil, flaxseeds, walnuts, almonds, and seeds. Eat more omega-3 fats. Choose salmon, mackerel, sardines, tuna, flaxseed oil, and ground flaxseeds. Aim to eat fish at least 2 times each week. Check food labels carefully to identify foods with trans fats or high amounts of saturated fat. Limit saturated fats. These are found in animal products, such as meats, butter, and cream. Plant sources of saturated fats include palm oil, palm kernel oil, and coconut oil. Avoid foods with partially hydrogenated oils  in them. These contain trans fats. Examples are stick margarine, some tub margarines, cookies, crackers, and other baked goods. Avoid fried foods. General information Eat more home-cooked food and less restaurant, buffet, and fast food. Limit or avoid alcohol. Limit foods that are high in added sugar and simple starches such as foods made using white refined flour (white breads, pastries, sweets). Lose weight if you are overweight. Losing just 5-10% of your body weight can help your overall health and prevent diseases such as diabetes and heart disease. Monitor your sodium intake, especially if you have high blood pressure. Talk with your health care provider about your sodium intake. Try to incorporate more vegetarian meals weekly. What foods should I eat? Fruits All fresh, canned (in natural juice), or frozen fruits. Vegetables Fresh or frozen vegetables (raw, steamed, roasted, or grilled). Green salads. Grains Most grains. Choose whole wheat and whole grains most of the time. Rice and pasta, including brown rice and pastas made with whole wheat. Meats and other proteins Lean, well-trimmed beef, veal, pork, and lamb. Chicken and [place] without skin. All fish and shellfish. Wild duck, rabbit, pheasant, and venison. Egg whites or low-cholesterol egg substitutes. Dried beans, peas, lentils, and tofu. Seeds and most nuts. Dairy Low-fat or nonfat cheeses, including ricotta and mozzarella. Skim or 1% milk (liquid, powdered,  or evaporated). Buttermilk made with low-fat milk. Nonfat or low-fat yogurt. Fats and oils Non-hydrogenated (trans-free) margarines. Vegetable oils, including soybean, sesame, sunflower, olive, avocado, peanut, safflower, corn, canola, and cottonseed. Salad dressings or mayonnaise made with a vegetable oil. Beverages Water (mineral or sparkling). Coffee and tea. Unsweetened ice tea. Diet beverages. Sweets and desserts Sherbet, gelatin, and fruit ice. Small amounts of dark  chocolate. Limit all sweets and desserts. Seasonings and condiments All seasonings and condiments. The items listed above may not be a complete list of foods and beverages you can eat. Contact a dietitian for more options. What foods should I avoid? Fruits Canned fruit in heavy syrup. Fruit in cream or butter sauce. Fried fruit. Limit coconut. Vegetables Vegetables cooked in cheese, cream, or butter sauce. Fried vegetables. Grains Breads made with saturated or trans fats, oils, or whole milk. Croissants. Sweet rolls. Donuts. High-fat crackers, such as cheese crackers and chips. Meats and other proteins Fatty meats, such as hot dogs, ribs, sausage, bacon, rib-eye roast or steak. High-fat deli meats, such as salami and bologna. Caviar. Domestic duck and goose. Organ meats, such as liver. Dairy Cream, sour cream, cream cheese, and creamed cottage cheese. Whole-milk cheeses. Whole or 2% milk (liquid, evaporated, or condensed). Whole buttermilk. Cream sauce or high-fat cheese sauce. Whole-milk yogurt. Fats and oils Meat fat, or shortening. Cocoa butter, hydrogenated oils, palm oil, coconut oil, palm kernel oil. Solid fats and shortenings, including bacon fat, salt pork, lard, and butter. Nondairy cream substitutes. Salad dressings with cheese or sour cream. Beverages Regular sodas and any drinks with added sugar. Sweets and desserts Frosting. Pudding. Cookies. Cakes. Pies. Milk chocolate or white chocolate. Buttered syrups. Full-fat ice cream or ice cream drinks. The items listed above may not be a complete list of foods and beverages to avoid. Contact a dietitian for more information. Summary Heart-healthy meal planning includes limiting unhealthy fats, increasing healthy fats, limiting salt (sodium) intake and making other diet and lifestyle changes. Lose weight if you are overweight. Losing just 5-10% of your body weight can help your overall health and prevent diseases such as diabetes and  heart disease. Focus on eating a balance of foods, including fruits and vegetables, low-fat or nonfat dairy, lean protein, nuts and legumes, whole grains, and heart-healthy oils and fats. This information is not intended to replace advice given to you by your health care provider. Make sure you discuss any questions you have with your health care provider. Document Revised: 08/10/2021 Document Reviewed: 08/10/2021 Elsevier Patient Education  2024 [employer].

## 2023-04-13 DIAGNOSIS — I110 Hypertensive heart disease with heart failure: Secondary | ICD-10-CM | POA: Diagnosis not present

## 2023-04-13 DIAGNOSIS — G4733 Obstructive sleep apnea (adult) (pediatric): Secondary | ICD-10-CM | POA: Diagnosis not present

## 2023-04-13 DIAGNOSIS — I10 Essential (primary) hypertension: Secondary | ICD-10-CM | POA: Diagnosis not present

## 2023-04-13 DIAGNOSIS — Z952 Presence of prosthetic heart valve: Secondary | ICD-10-CM | POA: Diagnosis not present

## 2023-04-13 DIAGNOSIS — Z48812 Encounter for surgical aftercare following surgery on the circulatory system: Secondary | ICD-10-CM | POA: Diagnosis not present

## 2023-04-13 DIAGNOSIS — Z96641 Presence of right artificial hip joint: Secondary | ICD-10-CM | POA: Diagnosis not present

## 2023-04-13 DIAGNOSIS — E039 Hypothyroidism, unspecified: Secondary | ICD-10-CM | POA: Diagnosis not present

## 2023-04-13 DIAGNOSIS — I2510 Atherosclerotic heart disease of native coronary artery without angina pectoris: Secondary | ICD-10-CM | POA: Diagnosis not present

## 2023-04-13 DIAGNOSIS — M810 Age-related osteoporosis without current pathological fracture: Secondary | ICD-10-CM | POA: Diagnosis not present

## 2023-04-13 DIAGNOSIS — Z955 Presence of coronary angioplasty implant and graft: Secondary | ICD-10-CM | POA: Diagnosis not present

## 2023-04-13 DIAGNOSIS — I5032 Chronic diastolic (congestive) heart failure: Secondary | ICD-10-CM | POA: Diagnosis not present

## 2023-04-14 DIAGNOSIS — Z952 Presence of prosthetic heart valve: Secondary | ICD-10-CM | POA: Diagnosis not present

## 2023-04-14 DIAGNOSIS — I110 Hypertensive heart disease with heart failure: Secondary | ICD-10-CM | POA: Diagnosis not present

## 2023-04-14 DIAGNOSIS — I10 Essential (primary) hypertension: Secondary | ICD-10-CM | POA: Diagnosis not present

## 2023-04-14 DIAGNOSIS — M810 Age-related osteoporosis without current pathological fracture: Secondary | ICD-10-CM | POA: Diagnosis not present

## 2023-04-14 DIAGNOSIS — Z96641 Presence of right artificial hip joint: Secondary | ICD-10-CM | POA: Diagnosis not present

## 2023-04-14 DIAGNOSIS — I5032 Chronic diastolic (congestive) heart failure: Secondary | ICD-10-CM | POA: Diagnosis not present

## 2023-04-14 DIAGNOSIS — I2510 Atherosclerotic heart disease of native coronary artery without angina pectoris: Secondary | ICD-10-CM | POA: Diagnosis not present

## 2023-04-14 DIAGNOSIS — G4733 Obstructive sleep apnea (adult) (pediatric): Secondary | ICD-10-CM | POA: Diagnosis not present

## 2023-04-14 DIAGNOSIS — Z48812 Encounter for surgical aftercare following surgery on the circulatory system: Secondary | ICD-10-CM | POA: Diagnosis not present

## 2023-04-14 DIAGNOSIS — Z955 Presence of coronary angioplasty implant and graft: Secondary | ICD-10-CM | POA: Diagnosis not present

## 2023-04-14 DIAGNOSIS — E039 Hypothyroidism, unspecified: Secondary | ICD-10-CM | POA: Diagnosis not present

## 2023-04-20 DIAGNOSIS — G4733 Obstructive sleep apnea (adult) (pediatric): Secondary | ICD-10-CM | POA: Diagnosis not present

## 2023-04-20 DIAGNOSIS — Z48812 Encounter for surgical aftercare following surgery on the circulatory system: Secondary | ICD-10-CM | POA: Diagnosis not present

## 2023-04-20 DIAGNOSIS — I10 Essential (primary) hypertension: Secondary | ICD-10-CM | POA: Diagnosis not present

## 2023-04-20 DIAGNOSIS — M810 Age-related osteoporosis without current pathological fracture: Secondary | ICD-10-CM | POA: Diagnosis not present

## 2023-04-20 DIAGNOSIS — I5032 Chronic diastolic (congestive) heart failure: Secondary | ICD-10-CM | POA: Diagnosis not present

## 2023-04-20 DIAGNOSIS — E039 Hypothyroidism, unspecified: Secondary | ICD-10-CM | POA: Diagnosis not present

## 2023-04-20 DIAGNOSIS — Z96641 Presence of right artificial hip joint: Secondary | ICD-10-CM | POA: Diagnosis not present

## 2023-04-20 DIAGNOSIS — I2510 Atherosclerotic heart disease of native coronary artery without angina pectoris: Secondary | ICD-10-CM | POA: Diagnosis not present

## 2023-04-20 DIAGNOSIS — Z952 Presence of prosthetic heart valve: Secondary | ICD-10-CM | POA: Diagnosis not present

## 2023-04-20 DIAGNOSIS — Z955 Presence of coronary angioplasty implant and graft: Secondary | ICD-10-CM | POA: Diagnosis not present

## 2023-04-20 DIAGNOSIS — I110 Hypertensive heart disease with heart failure: Secondary | ICD-10-CM | POA: Diagnosis not present

## 2023-04-22 DIAGNOSIS — G4733 Obstructive sleep apnea (adult) (pediatric): Secondary | ICD-10-CM | POA: Diagnosis not present

## 2023-04-22 DIAGNOSIS — Z48812 Encounter for surgical aftercare following surgery on the circulatory system: Secondary | ICD-10-CM | POA: Diagnosis not present

## 2023-04-22 DIAGNOSIS — E039 Hypothyroidism, unspecified: Secondary | ICD-10-CM | POA: Diagnosis not present

## 2023-04-22 DIAGNOSIS — Z952 Presence of prosthetic heart valve: Secondary | ICD-10-CM | POA: Diagnosis not present

## 2023-04-22 DIAGNOSIS — I2510 Atherosclerotic heart disease of native coronary artery without angina pectoris: Secondary | ICD-10-CM | POA: Diagnosis not present

## 2023-04-22 DIAGNOSIS — Z955 Presence of coronary angioplasty implant and graft: Secondary | ICD-10-CM | POA: Diagnosis not present

## 2023-04-22 DIAGNOSIS — I5032 Chronic diastolic (congestive) heart failure: Secondary | ICD-10-CM | POA: Diagnosis not present

## 2023-04-22 DIAGNOSIS — Z96641 Presence of right artificial hip joint: Secondary | ICD-10-CM | POA: Diagnosis not present

## 2023-04-22 DIAGNOSIS — I110 Hypertensive heart disease with heart failure: Secondary | ICD-10-CM | POA: Diagnosis not present

## 2023-04-22 DIAGNOSIS — I10 Essential (primary) hypertension: Secondary | ICD-10-CM | POA: Diagnosis not present

## 2023-04-22 DIAGNOSIS — M810 Age-related osteoporosis without current pathological fracture: Secondary | ICD-10-CM | POA: Diagnosis not present

## 2023-04-26 ENCOUNTER — Other Ambulatory Visit: Payer: Self-pay | Admitting: Surgery

## 2023-04-26 DIAGNOSIS — I350 Nonrheumatic aortic (valve) stenosis: Secondary | ICD-10-CM

## 2023-04-27 ENCOUNTER — Ambulatory Visit (INDEPENDENT_AMBULATORY_CARE_PROVIDER_SITE_OTHER): Payer: Self-pay | Admitting: Surgery

## 2023-04-27 ENCOUNTER — Encounter: Payer: Self-pay | Admitting: Surgery

## 2023-04-27 ENCOUNTER — Ambulatory Visit
Admission: RE | Admit: 2023-04-27 | Discharge: 2023-04-27 | Disposition: A | Discharge status: Home / Self Care | Payer: Medicare Other | Source: Ambulatory Visit | Attending: Surgery | Admitting: Surgery

## 2023-04-27 VITALS — BP 172/79 | HR 69 | Resp 18 | Ht 63.0 in | Wt 153.0 lb

## 2023-04-27 DIAGNOSIS — Z952 Presence of prosthetic heart valve: Secondary | ICD-10-CM | POA: Diagnosis not present

## 2023-04-27 DIAGNOSIS — I350 Nonrheumatic aortic (valve) stenosis: Secondary | ICD-10-CM

## 2023-04-27 NOTE — Progress Notes (Signed)
   HPI: Patient returns for routine postoperative follow-up having undergone aortic valve replacement using a 23 mm Edwards INSPIRIS RESILIA pericardial valve on 03/22/2023. The patient's early postoperative recovery while in the hospital was notable for an uncomplicated postoperative course. Since hospital discharge the patient reports that she has been feeling well.  She is walking daily without chest pain or shortness of breath and her stamina is improving.   Current Outpatient Medications  Medication Sig Dispense Refill   aspirin EC 325 MG tablet Take 1 tablet (325 mg total) by mouth daily.     atorvastatin (LIPITOR) 10 MG tablet Take 10 mg by mouth daily.     benazepril (LOTENSIN) 20 MG tablet Take 20 mg by mouth daily.  2   levothyroxine (SYNTHROID) 112 MCG tablet Take 112 mcg by mouth daily before breakfast.     Metoprolol Tartrate (LOPRESSOR) 25 MG tablet Take 1.5 tablets (37.5 mg total) by mouth 2 (two) times daily. 90 tablet 3   No current facility-administered medications for this visit.    Physical Exam: BP (!) 172/79 (BP Location: Left Arm, Patient Position: Sitting)   Pulse 69   Resp 18   Ht 5\' 3"  (1.6 m)   Wt 153 lb (69.4 kg)   SpO2 93% Comment: RA  BMI 27.10 kg/m  She looks well. Cardiac exam shows a regular rate and rhythm with normal heart sounds.  There is no murmur. Lungs are clear. The chest incision is well-healed and the sternum is stable. There is no peripheral edema.  Diagnostic Tests:  Narrative & Impression  CLINICAL DATA:  Aortic valve replacement   EXAM: CHEST - 2 VIEW   COMPARISON:  03/28/2023   FINDINGS: Mild cardiomegaly status post median sternotomy with aortic valve prosthesis. No acute abnormality of the lungs. Disc degenerative disease of the thoracic spine.   IMPRESSION: Mild cardiomegaly status post median sternotomy with aortic valve prosthesis. No acute abnormality of the lungs.     Electronically Signed   By: Jearld Lesch  M.D.   On: 04/27/2023 10:35    Impression:  She is doing well 5 weeks following her surgery.  She has already returned to driving a car.  I asked her not to lift anything heavier than 10 pounds for 3 months postoperatively.  She is scheduled for a follow-up echocardiogram next week.  Her blood pressure is elevated today.  She said that she has been checking her blood pressure at home daily and it is usually 120/80 or less.  It was 156/80 when she saw cardiology on 04/12/23.  I asked her to continue checking it at home and keep a chart.  Plan: She will continue to follow-up with cardiology and will return to see me if she has any problems with her incisions.   Alleen Borne, MD [facility] [phone]

## 2023-05-03 ENCOUNTER — Ambulatory Visit (HOSPITAL_COMMUNITY): Payer: Medicare Other | Attending: Cardiovascular Disease

## 2023-05-03 DIAGNOSIS — I359 Nonrheumatic aortic valve disorder, unspecified: Secondary | ICD-10-CM | POA: Insufficient documentation

## 2023-05-03 DIAGNOSIS — Z952 Presence of prosthetic heart valve: Secondary | ICD-10-CM | POA: Diagnosis not present

## 2023-05-03 LAB — ECHOCARDIOGRAM COMPLETE
AR max vel: 1.41 cm2
AV Area VTI: 1.46 cm2
AV Area mean vel: 1.45 cm2
AV Mean grad: 8 mm[Hg]
AV Peak grad: 13.5 mm[Hg]
Ao pk vel: 1.84 m/s
Area-P 1/2: 3.6 cm2
MV M vel: 5.24 m/s
MV Peak grad: 109.8 mm[Hg]
S' Lateral: 2.5 cm

## 2023-06-19 ENCOUNTER — Other Ambulatory Visit: Payer: Self-pay | Admitting: Physician Assistant

## 2023-06-20 ENCOUNTER — Telehealth: Payer: Self-pay | Admitting: Cardiovascular Disease

## 2023-06-20 MED ORDER — METOPROLOL TARTRATE 25 MG PO TABS: 37.5000 mg | ORAL_TABLET | Freq: Two times a day (BID) | ORAL | 2 refills | Status: DC

## 2023-06-20 NOTE — Telephone Encounter (Signed)
Pt says she got on [facility] app over the weekend and it said something about refills/prior auth. Called and spoke to [facility] who reports that all pt needs is refills sent in. Made pt aware and informed that we would send in refills now. Pt appreciates the help with this.

## 2023-06-20 NOTE — Telephone Encounter (Signed)
Pt c/o medication issue:  1. Name of Medication:  Metoprolol Tartrate (LOPRESSOR) 25 MG tablet   2. How are you currently taking this medication (dosage and times per day)? 1.5 mg tablets twice a day   3. Are you having a reaction (difficulty breathing--STAT)? No   4. What is your medication issue? Patient is calling stating she is needing PA in order to refill this medication. Please advise.

## 2023-08-12 ENCOUNTER — Ambulatory Visit: Payer: Medicare Other | Admitting: Physician Assistant

## 2023-09-05 NOTE — Progress Notes (Signed)
 Date:  09/14/2023   ID:  AYSLIN KUNDERT, DOB 1945-11-21, MRN [id number]  PCP:  Lonie Peak, PA-C  Cardiologist:  Eden Emms Electrophysiologist:  None   Evaluation Performed:  Follow-Up Visit  Chief Complaint:  Aortic Valve Disease   History of Present Illness:    78 y.o. followed since 2017-10-01 for aortic stenosis . 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 Husband died in 2019/10/02 They were married for 57 years Son and daughter helping with farm   TTE 02/14/23 mean gradient 52 mmHg peak 85 mmHg DVI 0.22 AVA 0.69 cm2  Cath 02/25/23 with no obstructive CAD Cardiac CTA showed dense calcium with bicuspid valve and TAVR thought to be non ideal due to risk of PVL.   She had AVR with Dr Laneta Simmers 03/22/23 with 23 mm Edwards Inspiris REsilia pericardial valve  Post op TTE showed EF 65% severe MAC no PVL with mean gradient 8 peak 13.5 mm Hg normal functioning valve  BP elevated Not drinking ETOH and watching salt Been high since surgery Compliant with meds No headaches, TIA or other symptoms       Past Medical History:  Diagnosis Date   Arthritis    right hip   Heart murmur    Hypertension    Hypothyroidism    Osteoporosis    Pneumonia    Hx   Severe aortic stenosis    Sleep apnea    before weight loss- does not wear cpap anymore after losing 40 lbs   Past Surgical History:  Procedure Laterality Date   AORTIC VALVE REPLACEMENT N/A 03/22/2023   Procedure: AORTIC VALVE REPLACEMENT (AVR) USING A INSPIRIS RESILIA  VALVE.;  Surgeon: Alleen Borne, MD;  Location: [facility] OR;  Service: Open Heart Surgery;  Laterality: N/A;   BREAST BIOPSY Bilateral    COLONOSCOPY     PARTIAL HYSTERECTOMY  1980's   RIGHT/LEFT HEART CATH AND CORONARY ANGIOGRAPHY N/A 02/25/2023   Procedure: RIGHT/LEFT HEART CATH AND CORONARY ANGIOGRAPHY;  Surgeon: Kathleene Hazel, MD;  Location: [facility] INVASIVE CV LAB;  Service: Cardiovascular;  Laterality: N/A;   TEE WITHOUT CARDIOVERSION N/A 03/22/2023    Procedure: TRANSESOPHAGEAL ECHOCARDIOGRAM;  Surgeon: Alleen Borne, MD;  Location: [facility] OR;  Service: Open Heart Surgery;  Laterality: N/A;   TOTAL HIP ARTHROPLASTY Right 11/28/2020   Procedure: RIGHT TOTAL HIP ARTHROPLASTY ANTERIOR APPROACH;  Surgeon: Eldred Manges, MD;  Location: [facility] OR;  Service: Orthopedics;  Laterality: Right;   TUBAL LIGATION       Current Meds  Medication Sig   aspirin EC 325 MG tablet Take 1 tablet (325 mg total) by mouth daily.   atorvastatin (LIPITOR) 10 MG tablet Take 10 mg by mouth daily.   benazepril (LOTENSIN) 20 MG tablet Take 20 mg by mouth daily.   levothyroxine (SYNTHROID) 112 MCG tablet Take 112 mcg by mouth daily before breakfast.   metoprolol tartrate (LOPRESSOR) 25 MG tablet Take 1.5 tablets (37.5 mg total) by mouth 2 (two) times daily.     Allergies:   Patient has no known allergies.   Social History   Tobacco Use   Smoking status: Never   Smokeless tobacco: Never  Vaping Use   Vaping status: Never Used  Substance Use Topics   Alcohol use: No   Drug use: No     Family Hx: The patient's family history includes Alzheimer's disease in her mother; Stroke in her father.  ROS:   Please see the history of present  illness.     All other systems reviewed and are negative.   Prior CV studies:   The following studies were reviewed today:  TTE  02/14/23 AV Area (Vmax):    0.66 cm  AV Area (Vmean):   0.63 cm  AV Area (VTI):     0.69 cm  AV Vmax:           461.00 cm/s  AV Vmean:          316.500 cm/s  AV VTI:            1.066 m  AV Peak Grad:      85.0 mmHg  AV Mean Grad:      52.0 mmHg  LVOT Vmax:         96.70 cm/s  LVOT Vmean:        63.000 cm/s  LVOT VTI:          0.235 m  LVOT/AV VTI ratio: 0.22   Labs/Other Tests and Data Reviewed:    EKG:  SR rate 68 normal no LVH 11/21/17  Recent Labs: 03/18/2023: ALT 15 03/23/2023: Magnesium 2.1 03/26/2023: Hemoglobin 10.1; Platelets 194 03/27/2023: BUN 17; Creatinine, Ser 0.84; Potassium  4.0; Sodium 136   Recent Lipid Panel No results found for: "CHOL", "TRIG", "HDL", "CHOLHDL", "LDLCALC", "LDLDIRECT"  Wt Readings from Last 3 Encounters:  09/14/23 153 lb 3.2 oz (69.5 kg)  04/27/23 153 lb (69.4 kg)  04/12/23 155 lb (70.3 kg)     Objective:    Vital Signs:  BP (!) 200/92   Pulse 67   Ht 5\' 3"  (1.6 m)   Wt 153 lb 3.2 oz (69.5 kg)   SpO2 97%   BMI 27.14 kg/m    Affect appropriate Healthy:  appears stated age HEENT: normal Neck supple with no adenopathy JVP normal no bruits no thyromegaly Lungs clear with no wheezing and good diaphragmatic motion Heart:  S1/S2 SEM through AVR sternotomy well healed  Abdomen: benighn, BS positve, no tenderness, no AAA no bruit.  No HSM or HJR Distal pulses intact with no bruits No edema Neuro non-focal Skin warm and dry Right THR    ASSESSMENT & PLAN:    AS: post AVR see details above SBE. Post op echo shows normal function   HTN:  Poor control since surgery start lopressor 12.5 bid and Diovan hydrochlorothiazide 160/12.5 mg     Thyroid:  On replacement TSH with primary in [place] dose recently decreased   ChoL  On statin Cholesterol is at goal.  Continue current dose of statin and diet Rx.  No myalgias or side effects.  F/U  Labs with primary    Medication Adjustments/Labs and Tests Ordered: Current medicines are reviewed at length with the patient today.  Concerns regarding medicines are outlined above.   Tests Ordered: No orders of the defined types were placed in this encounter.  None  Medication Changes: No orders of the defined types were placed in this encounter.  D/c lotensin Diovan/hydrochlorothiazide Lopressor  Disposition:  Follow up in 6 months  with me 4-6 weeks with BP clinic   Signed, Charlton Haws, MD  09/14/2023 9:29 AM    [facility]

## 2023-09-14 ENCOUNTER — Encounter: Payer: Self-pay | Admitting: Cardiovascular Disease

## 2023-09-14 ENCOUNTER — Ambulatory Visit: Payer: Medicare Other | Attending: Cardiovascular Disease | Admitting: Cardiovascular Disease

## 2023-09-14 VITALS — BP 200/92 | HR 67 | Ht 63.0 in | Wt 153.2 lb

## 2023-09-14 DIAGNOSIS — Z952 Presence of prosthetic heart valve: Secondary | ICD-10-CM | POA: Diagnosis not present

## 2023-09-14 DIAGNOSIS — I10 Essential (primary) hypertension: Secondary | ICD-10-CM

## 2023-09-14 DIAGNOSIS — E782 Mixed hyperlipidemia: Secondary | ICD-10-CM | POA: Diagnosis not present

## 2023-09-14 MED ORDER — VALSARTAN-HYDROCHLOROTHIAZIDE 160-12.5 MG PO TABS: 1.0000 | ORAL_TABLET | Freq: Every day | ORAL | 3 refills | Status: AC

## 2023-09-14 MED ORDER — METOPROLOL TARTRATE 25 MG PO TABS: 12.5000 mg | ORAL_TABLET | Freq: Two times a day (BID) | ORAL | 3 refills | Status: AC

## 2023-09-14 NOTE — Patient Instructions (Addendum)
 Medication Instructions:  Your physician has recommended you make the following change in your medication:   1-START Diovan/hydrochlorothiazide 160/12.5 mg by mouth daily 2-DECREASE metoprolol to 12.5 mg by mouth twice daily.  3-STOP Benazepril   *If you need a refill on your cardiac medications before your next appointment, please call your pharmacy*  Lab Work: If you have labs (blood work) drawn today and your tests are completely normal, you will receive your results only by: MyChart Message (if you have MyChart) OR A paper copy in the mail If you have any lab test that is abnormal or we need to change your treatment, we will call you to review the results.  Testing/Procedures: None ordered today.  Follow-Up: At [facility], you and your health needs are our priority.  As part of our continuing mission to provide you with exceptional heart care, we have created designated Provider Care Teams.  These Care Teams include your primary Cardiologist (physician) and Advanced Practice Providers (APPs -  Physician Assistants and Nurse Practitioners) who all work together to provide you with the care you need, when you need it.  We recommend signing up for the patient portal called "MyChart".  Sign up information is provided on this After Visit Summary.  MyChart is used to connect with patients for Virtual Visits (Telemedicine).  Patients are able to view lab/test results, encounter notes, upcoming appointments, etc.  Non-urgent messages can be sent to your provider as well.   To learn more about what you can do with MyChart, go to [email or web address].    Your next appointment:   4 to 6 weeks  Provider:   PA or NP  Your physician recommends that you schedule a follow-up appointment in: 6 month with Dr. Eden Emms.    Other Instructions       1st Floor: - Lobby - Registration  - Pharmacy  - Lab - Cafe  2nd Floor: - PV Lab - Diagnostic Testing (echo, CT, nuclear  med)  3rd Floor: - Vacant  4th Floor: - TCTS (cardiothoracic surgery) - AFib Clinic - Structural Heart Clinic - Vascular Surgery  - Vascular Ultrasound  5th Floor: - HeartCare Cardiology (general and EP) - Clinical Pharmacy for coumadin, hypertension, lipid, weight-loss medications, and med management appointments    Valet parking services will be available as well.

## 2023-10-05 DIAGNOSIS — I10 Essential (primary) hypertension: Secondary | ICD-10-CM | POA: Diagnosis not present

## 2023-10-05 DIAGNOSIS — E7800 Pure hypercholesterolemia, unspecified: Secondary | ICD-10-CM | POA: Diagnosis not present

## 2023-10-05 DIAGNOSIS — E039 Hypothyroidism, unspecified: Secondary | ICD-10-CM | POA: Diagnosis not present

## 2023-10-10 DIAGNOSIS — Z952 Presence of prosthetic heart valve: Secondary | ICD-10-CM | POA: Diagnosis not present

## 2023-10-10 DIAGNOSIS — E7800 Pure hypercholesterolemia, unspecified: Secondary | ICD-10-CM | POA: Diagnosis not present

## 2023-10-10 DIAGNOSIS — Z79899 Other long term (current) drug therapy: Secondary | ICD-10-CM | POA: Diagnosis not present

## 2023-10-10 DIAGNOSIS — I10 Essential (primary) hypertension: Secondary | ICD-10-CM | POA: Diagnosis not present

## 2023-10-10 DIAGNOSIS — Z139 Encounter for screening, unspecified: Secondary | ICD-10-CM | POA: Diagnosis not present

## 2023-10-10 DIAGNOSIS — E039 Hypothyroidism, unspecified: Secondary | ICD-10-CM | POA: Diagnosis not present

## 2023-10-17 NOTE — Progress Notes (Signed)
  Cardiology Office Note:  .   Date:  10/25/2023  ID:  Holly Patrick, DOB 21-Jan-1946, MRN [id number] PCP: Lonie Peak, PA-C  [facility] Providers Cardiologist:  Charlton Haws, MD    History of Present Illness: .   Holly Patrick is a 78 y.o. female with history of AVR 03/2023, cath non obstructive CAD, HTN.  Patient saw Dr. Eden Emms 08/2023 with elevated BP's-metoprolol started.   Patient comes in for f/u with her daughter. BP doing better on lower dose metoprolol and diovan-hct. 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 No chest pain, dyspnea, palpitations, dizziness, edema.  ROS:    Studies Reviewed: [name]         Prior CV Studies:   Echo 04/2023 IMPRESSIONS     1. Left ventricular ejection fraction, by estimation, is 60 to 65%. Left  ventricular ejection fraction by 3D volume is 65 %. The left ventricle has  normal function. The left ventricle has no regional Peltzer motion  abnormalities. There is moderate  concentric left ventricular hypertrophy. Left ventricular diastolic  parameters are consistent with Grade I diastolic dysfunction (impaired  relaxation). Elevated left ventricular end-diastolic pressure.   2. Right ventricular systolic function is normal. The right ventricular  size is normal. There is normal pulmonary artery systolic pressure.   3. Left atrial size was moderately dilated.   4. The mitral valve is degenerative. Mild mitral valve regurgitation. No  evidence of mitral stenosis. Severe mitral annular calcification.   5. The aortic valve has been repaired/replaced. Aortic valve  regurgitation is not visualized. No aortic stenosis is present. There is a  23 mm Inspiris valve present in the aortic position. Procedure Date:  03/22/2023. Echo findings are consistent with  normal structure and function of the aortic valve prosthesis. Aortic valve  area, by VTI measures 1.46 cm. Aortic valve mean gradient measures 8.0  mmHg. Aortic valve Vmax measures  1.84 m/s.   6. The inferior vena cava is normal in size with greater than 50%  respiratory variability, suggesting right atrial pressure of 3 mmHg.    Risk Assessment/Calculations:             Physical Exam:   VS:  BP 138/86 (BP Location: Right Arm, Patient Position: Sitting, Cuff Size: Normal)   Pulse 78   Resp 16   Ht 5\' 3"  (1.6 m)   Wt 156 lb (70.8 kg)   SpO2 95%   BMI 27.63 kg/m    Wt Readings from Last 3 Encounters:  10/25/23 156 lb (70.8 kg)  09/14/23 153 lb 3.2 oz (69.5 kg)  04/27/23 153 lb (69.4 kg)    GEN: Well nourished, well developed in no acute distress NECK: No JVD; No carotid bruits CARDIAC:  RRR, 2/6 systolic murmur LSB RESPIRATORY:  Clear to auscultation without rales, wheezing or rhonchi  ABDOMEN: Soft, non-tender, non-distended EXTREMITIES:  No edema; No deformity   ASSESSMENT AND PLAN: .    HTN-much better controlled on metoprol/diovan-HCT-labs done by PCP and are stable. Reviewed on [facility]  AVR 03/2023-doing well   Nonobstructive CAD  HLD-LDL92 in 09/2023-increase lipitor 20 mg daily. Check FLP in 3 months        Dispo: f/u with Dr. Eden Emms 03/2024  Signed, Jacolyn Reedy, PA-C

## 2023-10-25 ENCOUNTER — Ambulatory Visit: Payer: Medicare Other | Attending: Cardiology | Admitting: Physician Assistant

## 2023-10-25 VITALS — BP 138/86 | HR 78 | Resp 16 | Ht 63.0 in | Wt 156.0 lb

## 2023-10-25 DIAGNOSIS — I2510 Atherosclerotic heart disease of native coronary artery without angina pectoris: Secondary | ICD-10-CM | POA: Diagnosis not present

## 2023-10-25 DIAGNOSIS — I2583 Coronary atherosclerosis due to lipid rich plaque: Secondary | ICD-10-CM | POA: Diagnosis not present

## 2023-10-25 DIAGNOSIS — Z952 Presence of prosthetic heart valve: Secondary | ICD-10-CM

## 2023-10-25 DIAGNOSIS — I10 Essential (primary) hypertension: Secondary | ICD-10-CM

## 2023-10-25 DIAGNOSIS — E782 Mixed hyperlipidemia: Secondary | ICD-10-CM | POA: Diagnosis not present

## 2023-10-25 MED ORDER — ATORVASTATIN CALCIUM 20 MG PO TABS
20.0000 mg | ORAL_TABLET | Freq: Every day | ORAL | 3 refills | Status: AC
Start: 2023-10-25 — End: 2024-01-23

## 2023-10-25 NOTE — Patient Instructions (Addendum)
 Medication Instructions:  Your physician has recommended you make the following change in your medication:  INCREASE LIPITOR TO 20 MG DAILY.   *If you need a refill on your cardiac medications before your next appointment, please call your pharmacy*  Lab Work: TO BE DONE IN THREE MONTHS: FASTING LIPIDS   If you have labs (blood work) drawn today and your tests are completely normal, you will receive your results only by: MyChart Message (if you have MyChart) OR A paper copy in the mail If you have any lab test that is abnormal or we need to change your treatment, we will call you to review the results.  Testing/Procedures: NONE  Follow-Up: At [facility], you and your health needs are our priority.  As part of our continuing mission to provide you with exceptional heart care, our providers are all part of one team.  This team includes your primary Cardiologist (physician) and Advanced Practice Providers or APPs (Physician Assistants and Nurse Practitioners) who all work together to provide you with the care you need, when you need it.  Your next appointment:   SEPTEMBER 2025   Provider:   Charlton Haws, MD  We recommend signing up for the patient portal called "MyChart".  Sign up information is provided on this After Visit Summary.  MyChart is used to connect with patients for Virtual Visits (Telemedicine).  Patients are able to view lab/test results, encounter notes, upcoming appointments, etc.  Non-urgent messages can be sent to your provider as well.   To learn more about what you can do with MyChart, go to [email or web address].   Other Instructions       1st Floor: - Lobby - Registration  - Pharmacy  - Lab - Cafe  2nd Floor: - PV Lab - Diagnostic Testing (echo, CT, nuclear med)  3rd Floor: - Vacant  4th Floor: - TCTS (cardiothoracic surgery) - AFib Clinic - Structural Heart Clinic - Vascular Surgery  - Vascular Ultrasound  5th Floor: - HeartCare  Cardiology (general and EP) - Clinical Pharmacy for coumadin, hypertension, lipid, weight-loss medications, and med management appointments    Valet parking services will be available as well.

## 2023-11-23 DIAGNOSIS — Z1231 Encounter for screening mammogram for malignant neoplasm of breast: Secondary | ICD-10-CM | POA: Diagnosis not present

## 2024-01-26 ENCOUNTER — Other Ambulatory Visit: Payer: Self-pay | Admitting: Physician Assistant

## 2024-01-26 DIAGNOSIS — E782 Mixed hyperlipidemia: Secondary | ICD-10-CM | POA: Diagnosis not present

## 2024-01-27 ENCOUNTER — Ambulatory Visit: Payer: Self-pay | Admitting: Physician Assistant

## 2024-01-27 ENCOUNTER — Other Ambulatory Visit: Payer: Self-pay

## 2024-01-27 DIAGNOSIS — E782 Mixed hyperlipidemia: Secondary | ICD-10-CM

## 2024-01-27 LAB — LIPID PANEL
Chol/HDL Ratio: 3 ratio (ref 0.0–4.4)
Cholesterol, Total: 170 mg/dL (ref 100–199)
HDL: 56 mg/dL (ref >39)
LDL Chol Calc (NIH): 91 mg/dL (ref 0–99)
Triglycerides: 129 mg/dL (ref 0–149)
VLDL Cholesterol Cal: 23 mg/dL (ref 5–40)

## 2024-01-27 MED ORDER — EZETIMIBE 10 MG PO TABS: 10.0000 mg | ORAL_TABLET | Freq: Every day | ORAL | 3 refills | Status: AC

## 2024-04-04 DIAGNOSIS — E7800 Pure hypercholesterolemia, unspecified: Secondary | ICD-10-CM | POA: Diagnosis not present

## 2024-04-04 DIAGNOSIS — E039 Hypothyroidism, unspecified: Secondary | ICD-10-CM | POA: Diagnosis not present

## 2024-04-04 DIAGNOSIS — I10 Essential (primary) hypertension: Secondary | ICD-10-CM | POA: Diagnosis not present

## 2024-04-04 LAB — LAB REPORT - SCANNED: EGFR: 69

## 2024-04-10 DIAGNOSIS — Z952 Presence of prosthetic heart valve: Secondary | ICD-10-CM | POA: Diagnosis not present

## 2024-04-10 DIAGNOSIS — Z9181 History of falling: Secondary | ICD-10-CM | POA: Diagnosis not present

## 2024-04-10 DIAGNOSIS — I10 Essential (primary) hypertension: Secondary | ICD-10-CM | POA: Diagnosis not present

## 2024-04-10 DIAGNOSIS — E039 Hypothyroidism, unspecified: Secondary | ICD-10-CM | POA: Diagnosis not present

## 2024-04-10 DIAGNOSIS — I2510 Atherosclerotic heart disease of native coronary artery without angina pectoris: Secondary | ICD-10-CM | POA: Diagnosis not present

## 2024-06-28 DIAGNOSIS — J329 Chronic sinusitis, unspecified: Secondary | ICD-10-CM | POA: Diagnosis not present

## 2024-08-23 NOTE — Progress Notes (Unsigned)
 "   Date:  08/23/2024   ID:  Holly Patrick, DOB 1946-04-02, MRN [id number]  PCP:  Montey Lot, PA-C  Cardiologist:  Delford Electrophysiologist:  None   Evaluation Performed:  Follow-Up Visit  Chief Complaint:  Aortic Valve Disease   History of Present Illness:    79 y.o. followed since 09-24-2017 for aortic stenosis . 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 Husband died in 09/25/19 They were married for 39 years Son and daughter helping with farm   TTE 02/14/23 mean gradient 52 mmHg peak 85 mmHg DVI 0.22 AVA 0.69 cm2  Cath 02/25/23 with no obstructive CAD Cardiac CTA showed dense calcium  with bicuspid valve and TAVR thought to be non ideal due to risk of PVL.   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    ***   Past Medical History:  Diagnosis Date   Arthritis    right hip   Heart murmur    Hypertension    Hypothyroidism    Osteoporosis    Pneumonia    Hx   Severe aortic stenosis    Sleep apnea    before weight loss- does not wear cpap anymore after losing 40 lbs   Past Surgical History:  Procedure Laterality Date   AORTIC VALVE REPLACEMENT N/A 03/22/2023   Procedure: AORTIC VALVE REPLACEMENT (AVR) USING A INSPIRIS RESILIA  VALVE.;  Surgeon: Lucas Dorise MARLA, MD;  Location: [facility] OR;  Service: Open Heart Surgery;  Laterality: N/A;   BREAST BIOPSY Bilateral    COLONOSCOPY     PARTIAL HYSTERECTOMY  1980's   RIGHT/LEFT HEART CATH AND CORONARY ANGIOGRAPHY N/A 02/25/2023   Procedure: RIGHT/LEFT HEART CATH AND CORONARY ANGIOGRAPHY;  Surgeon: Verlin Lonni BIRCH, MD;  Location: [facility] INVASIVE CV LAB;  Service: Cardiovascular;   Laterality: N/A;   TEE WITHOUT CARDIOVERSION N/A 03/22/2023   Procedure: TRANSESOPHAGEAL ECHOCARDIOGRAM;  Surgeon: Lucas Dorise MARLA, MD;  Location[facility] OR;  Service: Open Heart Surgery;  Laterality: N/A;   TOTAL HIP ARTHROPLASTY Right 11/28/2020   Procedure: RIGHT TOTAL HIP ARTHROPLASTY ANTERIOR APPROACH;  Surgeon: Barbarann Oneil BROCKS, MD;  Location: [facility] OR;  Service: Orthopedics;  Laterality: Right;   TUBAL LIGATION       No outpatient medications have been marked as taking for the 09/05/24 encounter (Appointment) with Gurvir Schrom C, MD.     Allergies:   Patient has no known allergies.   Social History   Tobacco Use   Smoking status: Never   Smokeless tobacco: Never  Vaping Use   Vaping status: Never Used  Substance Use Topics   Alcohol use: No   Drug use: No     Family Hx: The patient's family history includes Alzheimer's disease in her mother; Stroke in her father.  ROS:   Please see the history of present illness.     All other systems reviewed and are negative.   Prior CV studies:   The following studies were reviewed today:  TTE  02/14/23 AV Area (Vmax):    0.66 cm  AV Area (Vmean):   0.63 cm  AV Area (VTI):  0.69 cm  AV Vmax:           461.00 cm/s  AV Vmean:          316.500 cm/s  AV VTI:            1.066 m  AV Peak Grad:      85.0 mmHg  AV Mean Grad:      52.0 mmHg  LVOT Vmax:         96.70 cm/s  LVOT Vmean:        63.000 cm/s  LVOT VTI:          0.235 m  LVOT/AV VTI ratio: 0.22   TTE 05/03/23  post AVR   AORTIC VALVE  AV Area (Vmax):    1.41 cm  AV Area (Vmean):   1.45 cm  AV Area (VTI):     1.46 cm  AV Vmax:           184.00 cm/s  AV Vmean:          128.000 cm/s  AV VTI:            0.450 m  AV Peak Grad:      13.5 mmHg  AV Mean Grad:      8.0 mmHg  LVOT Vmax:         102.00 cm/s  LVOT Vmean:        72.800 cm/s  LVOT VTI:          0.258 m  LVOT/AV VTI ratio: 0.57   Labs/Other Tests and Data Reviewed:    EKG:  SR rate 68 normal no LVH  11/21/17  Recent Labs: No results found for requested labs within last 365 days.   Recent Lipid Panel Lab Results  Component Value Date/Time   CHOL 170 01/26/2024 08:48 AM   TRIG 129 01/26/2024 08:48 AM   HDL 56 01/26/2024 08:48 AM   CHOLHDL 3.0 01/26/2024 08:48 AM   LDLCALC 91 01/26/2024 08:48 AM    Wt Readings from Last 3 Encounters:  10/25/23 156 lb (70.8 kg)  09/14/23 153 lb 3.2 oz (69.5 kg)  04/27/23 153 lb (69.4 kg)     Objective:    Vital Signs:  There were no vitals taken for this visit.   Affect appropriate Healthy:  appears stated age HEENT: normal Neck supple with no adenopathy JVP normal no bruits no thyromegaly Lungs clear with no wheezing and good diaphragmatic motion Heart:  S1/S2 SEM through AVR sternotomy well healed  Abdomen: benighn, BS positve, no tenderness, no AAA no bruit.  No HSM or HJR Distal pulses intact with no bruits No edema Neuro non-focal Skin warm and dry Right THR    ASSESSMENT & PLAN:    AS: post AVR see details above SBE. Post op echo shows normal function and mean gradient 8 peak 13.5 mmHg    HTN: improved on lopressor  and Dovan/HCT ***    Thyroid :  On replacement TSH with primary in [place] dose recently decreased   ChoL  On lipitor 20 mg and Zetia  10 mg  LDL 91 followed by primary   Medication Adjustments/Labs and Tests Ordered: Current medicines are reviewed at length with the patient today.  Concerns regarding medicines are outlined above.   Tests Ordered: No orders of the defined types were placed in this encounter.  None  Medication Changes: No orders of the defined types were placed in this encounter.  *** Disposition:  Follow up in 6 months  with me  4-6 weeks with BP clinic   Signed, Maude Emmer, MD  08/23/2024 2:17 PM    [facility] "

## 2024-09-05 ENCOUNTER — Ambulatory Visit: Admitting: Cardiovascular Disease
